# Patient Record
Sex: Female | Born: 1960 | Hispanic: No | Marital: Married | Smoking: Never smoker
Health system: Southern US, Community
[De-identification: ages and names within clinical notes are randomized; demographics above are authoritative.]

## PROBLEM LIST (undated history)

## (undated) DIAGNOSIS — I10 Essential (primary) hypertension: Secondary | ICD-10-CM

## (undated) DIAGNOSIS — E785 Hyperlipidemia, unspecified: Secondary | ICD-10-CM

## (undated) HISTORY — DX: Hyperlipidemia, unspecified: E78.5

## (undated) HISTORY — DX: Essential (primary) hypertension: I10

---

## 2004-04-01 ENCOUNTER — Other Ambulatory Visit: Admission: RE | Admit: 2004-04-01 | Discharge: 2004-04-01 | Payer: Self-pay | Admitting: Family Medicine

## 2005-08-13 ENCOUNTER — Other Ambulatory Visit: Admission: RE | Admit: 2005-08-13 | Discharge: 2005-08-13 | Payer: Self-pay | Admitting: Family Medicine

## 2006-09-11 ENCOUNTER — Other Ambulatory Visit: Admission: RE | Admit: 2006-09-11 | Discharge: 2006-09-11 | Payer: Self-pay | Admitting: Family Medicine

## 2008-10-25 ENCOUNTER — Other Ambulatory Visit: Admission: RE | Admit: 2008-10-25 | Discharge: 2008-10-25 | Payer: Self-pay | Admitting: Family Medicine

## 2009-11-21 ENCOUNTER — Other Ambulatory Visit: Admission: RE | Admit: 2009-11-21 | Discharge: 2009-11-21 | Payer: Self-pay | Admitting: *Deleted

## 2011-01-21 ENCOUNTER — Other Ambulatory Visit: Payer: Self-pay | Admitting: Family Medicine

## 2011-01-21 ENCOUNTER — Other Ambulatory Visit (HOSPITAL_COMMUNITY)
Admission: RE | Admit: 2011-01-21 | Discharge: 2011-01-21 | Disposition: A | Discharge status: Home / Self Care | Payer: Self-pay | Source: Ambulatory Visit | Attending: Family Medicine | Admitting: Family Medicine

## 2011-01-21 DIAGNOSIS — Z124 Encounter for screening for malignant neoplasm of cervix: Secondary | ICD-10-CM | POA: Insufficient documentation

## 2011-03-28 ENCOUNTER — Other Ambulatory Visit: Payer: Self-pay | Admitting: Family Medicine

## 2012-10-21 ENCOUNTER — Other Ambulatory Visit (HOSPITAL_COMMUNITY)
Admission: RE | Admit: 2012-10-21 | Discharge: 2012-10-21 | Disposition: A | Discharge status: Home / Self Care | Payer: Federal, State, Local not specified - PPO | Source: Ambulatory Visit | Attending: Family Medicine | Admitting: Family Medicine

## 2012-10-21 ENCOUNTER — Other Ambulatory Visit: Payer: Self-pay | Admitting: Family Medicine

## 2012-10-21 DIAGNOSIS — Z124 Encounter for screening for malignant neoplasm of cervix: Secondary | ICD-10-CM | POA: Insufficient documentation

## 2013-11-18 ENCOUNTER — Other Ambulatory Visit: Payer: Self-pay | Admitting: Family Medicine

## 2013-11-18 DIAGNOSIS — R209 Unspecified disturbances of skin sensation: Secondary | ICD-10-CM

## 2013-11-20 ENCOUNTER — Other Ambulatory Visit: Payer: Federal, State, Local not specified - PPO

## 2015-10-29 ENCOUNTER — Other Ambulatory Visit (HOSPITAL_COMMUNITY)
Admission: RE | Admit: 2015-10-29 | Discharge: 2015-10-29 | Disposition: A | Discharge status: Home / Self Care | Payer: Federal, State, Local not specified - PPO | Source: Ambulatory Visit | Attending: Family Medicine | Admitting: Family Medicine

## 2015-10-29 ENCOUNTER — Other Ambulatory Visit: Payer: Self-pay | Admitting: Family Medicine

## 2015-10-29 DIAGNOSIS — Z01419 Encounter for gynecological examination (general) (routine) without abnormal findings: Secondary | ICD-10-CM | POA: Insufficient documentation

## 2015-10-30 LAB — CYTOLOGY - PAP

## 2015-12-31 DIAGNOSIS — Z1231 Encounter for screening mammogram for malignant neoplasm of breast: Secondary | ICD-10-CM | POA: Diagnosis not present

## 2015-12-31 DIAGNOSIS — Z139 Encounter for screening, unspecified: Secondary | ICD-10-CM | POA: Diagnosis not present

## 2016-01-31 DIAGNOSIS — L218 Other seborrheic dermatitis: Secondary | ICD-10-CM | POA: Diagnosis not present

## 2016-01-31 DIAGNOSIS — Z809 Family history of malignant neoplasm, unspecified: Secondary | ICD-10-CM | POA: Diagnosis not present

## 2016-01-31 DIAGNOSIS — Z1283 Encounter for screening for malignant neoplasm of skin: Secondary | ICD-10-CM | POA: Diagnosis not present

## 2016-02-26 DIAGNOSIS — K080 Exfoliation of teeth due to systemic causes: Secondary | ICD-10-CM | POA: Diagnosis not present

## 2016-03-04 DIAGNOSIS — E559 Vitamin D deficiency, unspecified: Secondary | ICD-10-CM | POA: Diagnosis not present

## 2016-03-04 DIAGNOSIS — S90861A Insect bite (nonvenomous), right foot, initial encounter: Secondary | ICD-10-CM | POA: Diagnosis not present

## 2016-03-06 DIAGNOSIS — S90861A Insect bite (nonvenomous), right foot, initial encounter: Secondary | ICD-10-CM | POA: Diagnosis not present

## 2016-03-06 DIAGNOSIS — W57XXXA Bitten or stung by nonvenomous insect and other nonvenomous arthropods, initial encounter: Secondary | ICD-10-CM | POA: Diagnosis not present

## 2016-05-22 DIAGNOSIS — L570 Actinic keratosis: Secondary | ICD-10-CM | POA: Diagnosis not present

## 2016-05-22 DIAGNOSIS — Z809 Family history of malignant neoplasm, unspecified: Secondary | ICD-10-CM | POA: Diagnosis not present

## 2016-05-22 DIAGNOSIS — L821 Other seborrheic keratosis: Secondary | ICD-10-CM | POA: Diagnosis not present

## 2016-09-03 DIAGNOSIS — K080 Exfoliation of teeth due to systemic causes: Secondary | ICD-10-CM | POA: Diagnosis not present

## 2016-11-04 DIAGNOSIS — Z79899 Other long term (current) drug therapy: Secondary | ICD-10-CM | POA: Diagnosis not present

## 2016-11-04 DIAGNOSIS — R6889 Other general symptoms and signs: Secondary | ICD-10-CM | POA: Diagnosis not present

## 2016-11-04 DIAGNOSIS — Z13220 Encounter for screening for lipoid disorders: Secondary | ICD-10-CM | POA: Diagnosis not present

## 2016-11-04 DIAGNOSIS — I10 Essential (primary) hypertension: Secondary | ICD-10-CM | POA: Diagnosis not present

## 2016-11-04 DIAGNOSIS — E559 Vitamin D deficiency, unspecified: Secondary | ICD-10-CM | POA: Diagnosis not present

## 2016-11-04 DIAGNOSIS — Z0000 Encounter for general adult medical examination without abnormal findings: Secondary | ICD-10-CM | POA: Diagnosis not present

## 2016-11-04 DIAGNOSIS — G479 Sleep disorder, unspecified: Secondary | ICD-10-CM | POA: Diagnosis not present

## 2016-11-20 DIAGNOSIS — Z809 Family history of malignant neoplasm, unspecified: Secondary | ICD-10-CM | POA: Diagnosis not present

## 2016-11-20 DIAGNOSIS — D225 Melanocytic nevi of trunk: Secondary | ICD-10-CM | POA: Diagnosis not present

## 2016-11-20 DIAGNOSIS — Z1283 Encounter for screening for malignant neoplasm of skin: Secondary | ICD-10-CM | POA: Diagnosis not present

## 2016-12-10 DIAGNOSIS — E2839 Other primary ovarian failure: Secondary | ICD-10-CM | POA: Diagnosis not present

## 2017-01-21 DIAGNOSIS — Z1231 Encounter for screening mammogram for malignant neoplasm of breast: Secondary | ICD-10-CM | POA: Diagnosis not present

## 2017-04-01 DIAGNOSIS — K080 Exfoliation of teeth due to systemic causes: Secondary | ICD-10-CM | POA: Diagnosis not present

## 2017-04-17 DIAGNOSIS — R002 Palpitations: Secondary | ICD-10-CM | POA: Diagnosis not present

## 2017-04-17 DIAGNOSIS — F418 Other specified anxiety disorders: Secondary | ICD-10-CM | POA: Diagnosis not present

## 2017-04-17 DIAGNOSIS — I10 Essential (primary) hypertension: Secondary | ICD-10-CM | POA: Diagnosis not present

## 2017-04-17 DIAGNOSIS — E559 Vitamin D deficiency, unspecified: Secondary | ICD-10-CM | POA: Diagnosis not present

## 2017-04-20 ENCOUNTER — Telehealth: Payer: Self-pay | Admitting: *Deleted

## 2017-04-20 NOTE — Telephone Encounter (Signed)
NOTES SENT TO SCHEDULING.  °

## 2017-04-29 ENCOUNTER — Encounter: Payer: Self-pay | Admitting: Cardiology

## 2017-04-29 ENCOUNTER — Ambulatory Visit (INDEPENDENT_AMBULATORY_CARE_PROVIDER_SITE_OTHER): Payer: Federal, State, Local not specified - PPO | Admitting: Cardiology

## 2017-04-29 VITALS — BP 148/78 | HR 76 | Ht 61.0 in | Wt 151.1 lb

## 2017-04-29 DIAGNOSIS — I10 Essential (primary) hypertension: Secondary | ICD-10-CM | POA: Diagnosis not present

## 2017-04-29 DIAGNOSIS — R0789 Other chest pain: Secondary | ICD-10-CM | POA: Insufficient documentation

## 2017-04-29 DIAGNOSIS — R002 Palpitations: Secondary | ICD-10-CM | POA: Diagnosis not present

## 2017-04-29 NOTE — Patient Instructions (Signed)
Medication Instructions:  Your physician recommends that you continue on your current medications as directed. Please refer to the Current Medication list given to you today.  Labwork: None   Testing/Procedures: Your physician has requested that you have en exercise stress myoview. For further information please visit [email or web address]. Please follow instruction sheet, as given.  Please report to [address], [address] [place], [place] the day of your testing.   Follow-Up: Your physician recommends that you schedule a follow-up appointment in: 1 months   Any Other Special Instructions Will Be Listed Below (If Applicable).  Please note that any paperwork needing to be filled out by the provider will need to be addressed at the front desk prior to seeing the provider. Please note that any paperwork FMLA, Disability or other documents regarding health condition is subject to a $25.00 charge that must be received prior to completion of paperwork in the form of a money order or check.    If you need a refill on your cardiac medications before your next appointment, please call your pharmacy.

## 2017-04-29 NOTE — Progress Notes (Signed)
Cardiology Office Note:    Date:  04/29/2017   ID:  Audrey Mendez, DOB July 11, 1961, MRN [id number]  PCP:  Juluis RainierBarnes, Elizabeth, MD  Cardiologist:  Garwin Brothersajan R Revankar, MD   Referring MD: Juluis RainierBarnes, Elizabeth, MD    ASSESSMENT:    1. Palpitation   2. Chest tightness   3. Essential hypertension   4. Palpitations    PLAN:    In order of problems listed above:  1. Primary prevention stressed to the patient. Importance of compliance with diet and medications stressed. Her chest pain is atypical for coronary etiology however she will need evaluation and therefore she is being scheduled for an exercise stress Cardiolite. She is on appropriate blood pressure medications and her blood pressure stable and she will continue to monitor this. If her stress test is negative then I will see her back in a month I could consider evaluating her for a beta blocker therapy. In the interim if she has any significant symptoms he knows to go to the nearest emergency room. She has a fairly physical job and with what she has  not experienced any chest pain or chest tightness. Her lipids are followed by her primary care physician. She will be seen in follow-up appointment in a month or earlier if she has any concerns. In the interim if she has any significant symptoms he knows to go to the nearest emergency room.   Medication Adjustments/Labs and Tests Ordered: Current medicines are reviewed at length with the patient today.  Concerns regarding medicines are outlined above.  Orders Placed This Encounter  Procedures  . Myocardial Perfusion Imaging  . EKG 12-Lead   No orders of the defined types were placed in this encounter.    History of Present Illness:    Audrey Mendez is a 56 y.o. female who is being seen today for the evaluation of Chest tightness and palpitations at the request of Juluis RainierBarnes, Elizabeth, MD. Patient is a pleasant 56 year old female. She has past medical history of essential hypertension. She  mentions to me that her husband is in no distress. Saturation at work. This has caused significant concern to her. She mentions to me that she occasionally has palpitations but when she feels this she checks her pulse and her findings are that her heart rate is within normal limits less than 70 or 80/m. She also complains of mild chest tightness not related to exertion. No orthopnea or PND. No radiation of the symptoms to any parts of the body. At the time of my evaluation she is alert awake oriented and in no distress.  History reviewed. No pertinent past medical history.  History reviewed. No pertinent surgical history.  Current Medications: Current Meds  Medication Sig  . Cholecalciferol (VITAMIN D) 2000 units CAPS Take 1 capsule by mouth daily.  Marland Kitchen. LORazepam (ATIVAN) 0.5 MG tablet Take 0.5 mg by mouth at bedtime as needed.  . quinapril-hydrochlorothiazide (ACCURETIC) 20-12.5 MG tablet Take 2 tablets by mouth 2 (two) times daily.     Allergies:   Sulfur colloidal [sulfur]; Penicillins; and Zoloft [sertraline hcl]   Social History   Social History  . Marital status: Unknown    Spouse name: N/A  . Number of children: N/A  . Years of education: N/A   Social History Main Topics  . Smoking status: Never Smoker  . Smokeless tobacco: Never Used  . Alcohol use No  . Drug use: No  . Sexual activity: Not Asked   Other Topics Concern  . None  Social History Narrative  . None     Family History: The patient's family history includes Atrial fibrillation in her mother; Heart attack in her brother; Heart failure in her father and mother; Hypertension in her mother.  ROS:   Please see the history of present illness.    All other systems reviewed and are negative.  EKGs/Labs/Other Studies Reviewed:    The following studies were reviewed today: I reviewed records from the primary care physician's office.   Recent Labs: No results found for requested labs within last 8760 hours.    Recent Lipid Panel No results found for: CHOL, TRIG, HDL, CHOLHDL, VLDL, LDLCALC, LDLDIRECT  Physical Exam:    VS:  BP (!) 148/78   Pulse 76   Ht 5\' 1"  (1.549 m)   Wt 151 lb 1.3 oz (68.5 kg)   SpO2 98%   BMI 28.55 kg/m     Wt Readings from Last 3 Encounters:  04/29/17 151 lb 1.3 oz (68.5 kg)     GEN: Patient is in no acute distress HEENT: Normal NECK: No JVD; No carotid bruits LYMPHATICS: No lymphadenopathy CARDIAC: S1 S2 regular, 2/6 systolic murmur at the apex. RESPIRATORY:  Clear to auscultation without rales, wheezing or rhonchi  ABDOMEN: Soft, non-tender, non-distended MUSCULOSKELETAL:  No edema; No deformity  SKIN: Warm and dry NEUROLOGIC:  Alert and oriented x 3 PSYCHIATRIC:  Normal affect    Signed, Garwin Brothers, MD  04/29/2017 4:50 PM    [facility]

## 2017-04-30 ENCOUNTER — Telehealth (HOSPITAL_COMMUNITY): Payer: Self-pay | Admitting: *Deleted

## 2017-04-30 NOTE — Telephone Encounter (Signed)
Patient given detailed instructions per Myocardial Perfusion Study Information Sheet for the test on 05/04/17 Patient notified to arrive 15 minutes early and that it is imperative to arrive on time for appointment to keep from having the test rescheduled.  If you need to cancel or reschedule your appointment, please call the office within 24 hours of your appointment. . Patient verbalized understanding. Audrey Mendez    

## 2017-05-04 ENCOUNTER — Ambulatory Visit (HOSPITAL_COMMUNITY): Payer: Federal, State, Local not specified - PPO | Attending: Cardiovascular Disease

## 2017-05-04 DIAGNOSIS — R002 Palpitations: Secondary | ICD-10-CM

## 2017-05-04 LAB — MYOCARDIAL PERFUSION IMAGING
CHL CUP MPHR: 163 {beats}/min
CHL CUP NUCLEAR SDS: 5
CHL CUP NUCLEAR SRS: 3
CHL CUP NUCLEAR SSS: 8
CHL CUP RESTING HR STRESS: 61 {beats}/min
CSEPED: 9 min
CSEPEW: 10.1 METS
CSEPPHR: 148 {beats}/min
Exercise duration (sec): 0 s
LHR: 0.32
LV dias vol: 70 mL (ref 46–106)
LV sys vol: 27 mL
Percent HR: 90 %
TID: 0.99

## 2017-05-04 MED ORDER — TECHNETIUM TC 99M TETROFOSMIN IV KIT
32.4000 | PACK | Freq: Once | INTRAVENOUS | Status: AC | PRN
  Administered 2017-05-04: 32.4 via INTRAVENOUS
  Filled 2017-05-04: qty 33

## 2017-05-04 MED ORDER — TECHNETIUM TC 99M TETROFOSMIN IV KIT
11.0000 | PACK | Freq: Once | INTRAVENOUS | Status: AC | PRN
  Administered 2017-05-04: 11 via INTRAVENOUS
  Filled 2017-05-04: qty 11

## 2017-06-01 ENCOUNTER — Ambulatory Visit: Payer: Federal, State, Local not specified - PPO | Admitting: Cardiology

## 2017-07-21 NOTE — Telephone Encounter (Signed)
Error

## 2017-10-12 DIAGNOSIS — K080 Exfoliation of teeth due to systemic causes: Secondary | ICD-10-CM | POA: Diagnosis not present

## 2017-11-06 DIAGNOSIS — Z0000 Encounter for general adult medical examination without abnormal findings: Secondary | ICD-10-CM | POA: Diagnosis not present

## 2017-11-06 DIAGNOSIS — I10 Essential (primary) hypertension: Secondary | ICD-10-CM | POA: Diagnosis not present

## 2017-11-06 DIAGNOSIS — G479 Sleep disorder, unspecified: Secondary | ICD-10-CM | POA: Diagnosis not present

## 2017-11-06 DIAGNOSIS — E7800 Pure hypercholesterolemia, unspecified: Secondary | ICD-10-CM | POA: Diagnosis not present

## 2017-11-06 DIAGNOSIS — E559 Vitamin D deficiency, unspecified: Secondary | ICD-10-CM | POA: Diagnosis not present

## 2017-11-06 DIAGNOSIS — Z79899 Other long term (current) drug therapy: Secondary | ICD-10-CM | POA: Diagnosis not present

## 2017-11-10 ENCOUNTER — Other Ambulatory Visit: Payer: Self-pay | Admitting: Family Medicine

## 2017-11-10 DIAGNOSIS — R198 Other specified symptoms and signs involving the digestive system and abdomen: Secondary | ICD-10-CM

## 2017-11-19 ENCOUNTER — Ambulatory Visit
Admission: RE | Admit: 2017-11-19 | Discharge: 2017-11-19 | Disposition: A | Discharge status: Home / Self Care | Payer: Federal, State, Local not specified - PPO | Source: Ambulatory Visit | Attending: Family Medicine | Admitting: Family Medicine

## 2017-11-19 DIAGNOSIS — K7689 Other specified diseases of liver: Secondary | ICD-10-CM | POA: Diagnosis not present

## 2017-11-19 DIAGNOSIS — R198 Other specified symptoms and signs involving the digestive system and abdomen: Secondary | ICD-10-CM

## 2017-11-24 DIAGNOSIS — L218 Other seborrheic dermatitis: Secondary | ICD-10-CM | POA: Diagnosis not present

## 2017-11-24 DIAGNOSIS — D239 Other benign neoplasm of skin, unspecified: Secondary | ICD-10-CM | POA: Diagnosis not present

## 2017-11-24 DIAGNOSIS — D1801 Hemangioma of skin and subcutaneous tissue: Secondary | ICD-10-CM | POA: Diagnosis not present

## 2018-04-19 DIAGNOSIS — K080 Exfoliation of teeth due to systemic causes: Secondary | ICD-10-CM | POA: Diagnosis not present

## 2018-06-29 IMAGING — NM NM MISC PROCEDURE
3 series · 18 of 18 positions shown · non-contrast
Comparison: none

[Series 1: wbr_s-proj_st stress_(id)_sa · 6.5mm · 6.51mm/px · 6 of 512 frames shown (1 of 2)]
[frame 43/512]
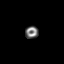
[frame 128/512]
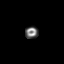
[frame 214/512]
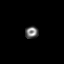
[frame 299/512]
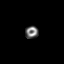
[frame 384/512]
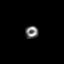
[frame 470/512]
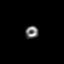

[Series 1: wbr_r-proj_st rest_(id)_sa · 6.5mm · 6.51mm/px · 6 of 64 frames shown]
[frame 6/64]
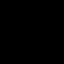
[frame 16/64]
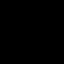
[frame 27/64]
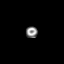
[frame 38/64]
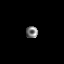
[frame 48/64]
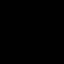
[frame 59/64]
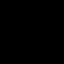

[Series 1: wbr_s-proj_st stress_(id)_sa · 6.5mm · 6.51mm/px · 6 of 64 frames shown (2 of 2)]
[frame 6/64]
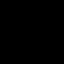
[frame 16/64]
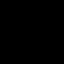
[frame 27/64]
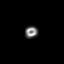
[frame 38/64]
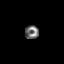
[frame 48/64]
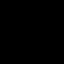
[frame 59/64]
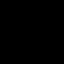

[18 of 18 positions shown; findings below may reference images not displayed]

Canned report from images found in remote index.

Refer to host system for actual result text.

## 2018-08-17 DIAGNOSIS — Z1239 Encounter for other screening for malignant neoplasm of breast: Secondary | ICD-10-CM | POA: Diagnosis not present

## 2018-08-17 DIAGNOSIS — Z1231 Encounter for screening mammogram for malignant neoplasm of breast: Secondary | ICD-10-CM | POA: Diagnosis not present

## 2018-11-19 DIAGNOSIS — E559 Vitamin D deficiency, unspecified: Secondary | ICD-10-CM | POA: Diagnosis not present

## 2018-11-19 DIAGNOSIS — E7800 Pure hypercholesterolemia, unspecified: Secondary | ICD-10-CM | POA: Diagnosis not present

## 2018-11-19 DIAGNOSIS — Z0000 Encounter for general adult medical examination without abnormal findings: Secondary | ICD-10-CM | POA: Diagnosis not present

## 2018-12-02 DIAGNOSIS — L218 Other seborrheic dermatitis: Secondary | ICD-10-CM | POA: Diagnosis not present

## 2018-12-02 DIAGNOSIS — D0362 Melanoma in situ of left upper limb, including shoulder: Secondary | ICD-10-CM | POA: Diagnosis not present

## 2018-12-02 DIAGNOSIS — D485 Neoplasm of uncertain behavior of skin: Secondary | ICD-10-CM | POA: Diagnosis not present

## 2018-12-02 DIAGNOSIS — D1801 Hemangioma of skin and subcutaneous tissue: Secondary | ICD-10-CM | POA: Diagnosis not present

## 2018-12-20 DIAGNOSIS — D0362 Melanoma in situ of left upper limb, including shoulder: Secondary | ICD-10-CM | POA: Diagnosis not present

## 2019-11-21 ENCOUNTER — Other Ambulatory Visit: Payer: Self-pay | Admitting: Family Medicine

## 2019-11-21 ENCOUNTER — Other Ambulatory Visit (HOSPITAL_COMMUNITY)
Admission: RE | Admit: 2019-11-21 | Discharge: 2019-11-21 | Disposition: A | Discharge status: Home / Self Care | Payer: Federal, State, Local not specified - PPO | Source: Ambulatory Visit | Attending: Family Medicine | Admitting: Family Medicine

## 2019-11-21 DIAGNOSIS — Z124 Encounter for screening for malignant neoplasm of cervix: Secondary | ICD-10-CM | POA: Insufficient documentation

## 2019-11-23 LAB — CYTOLOGY - PAP: Diagnosis: NEGATIVE

## 2020-04-16 LAB — HM COLONOSCOPY

## 2021-12-10 DIAGNOSIS — I10 Essential (primary) hypertension: Secondary | ICD-10-CM | POA: Diagnosis not present

## 2021-12-10 DIAGNOSIS — Z23 Encounter for immunization: Secondary | ICD-10-CM | POA: Diagnosis not present

## 2021-12-10 DIAGNOSIS — Z0000 Encounter for general adult medical examination without abnormal findings: Secondary | ICD-10-CM | POA: Diagnosis not present

## 2022-01-09 DIAGNOSIS — L578 Other skin changes due to chronic exposure to nonionizing radiation: Secondary | ICD-10-CM | POA: Diagnosis not present

## 2022-01-09 DIAGNOSIS — D1801 Hemangioma of skin and subcutaneous tissue: Secondary | ICD-10-CM | POA: Diagnosis not present

## 2022-01-09 DIAGNOSIS — D2372 Other benign neoplasm of skin of left lower limb, including hip: Secondary | ICD-10-CM | POA: Diagnosis not present

## 2022-01-09 DIAGNOSIS — Z85820 Personal history of malignant melanoma of skin: Secondary | ICD-10-CM | POA: Diagnosis not present

## 2022-02-07 DIAGNOSIS — M5442 Lumbago with sciatica, left side: Secondary | ICD-10-CM | POA: Diagnosis not present

## 2022-02-17 DIAGNOSIS — M5442 Lumbago with sciatica, left side: Secondary | ICD-10-CM | POA: Diagnosis not present

## 2022-02-19 DIAGNOSIS — M5442 Lumbago with sciatica, left side: Secondary | ICD-10-CM | POA: Diagnosis not present

## 2022-02-24 DIAGNOSIS — M5442 Lumbago with sciatica, left side: Secondary | ICD-10-CM | POA: Diagnosis not present

## 2022-02-26 DIAGNOSIS — M5442 Lumbago with sciatica, left side: Secondary | ICD-10-CM | POA: Diagnosis not present

## 2022-03-03 DIAGNOSIS — M5442 Lumbago with sciatica, left side: Secondary | ICD-10-CM | POA: Diagnosis not present

## 2022-03-10 DIAGNOSIS — M5442 Lumbago with sciatica, left side: Secondary | ICD-10-CM | POA: Diagnosis not present

## 2022-03-17 DIAGNOSIS — M5442 Lumbago with sciatica, left side: Secondary | ICD-10-CM | POA: Diagnosis not present

## 2022-03-26 DIAGNOSIS — M5442 Lumbago with sciatica, left side: Secondary | ICD-10-CM | POA: Diagnosis not present

## 2022-07-14 DIAGNOSIS — M9902 Segmental and somatic dysfunction of thoracic region: Secondary | ICD-10-CM | POA: Diagnosis not present

## 2022-07-14 DIAGNOSIS — M5442 Lumbago with sciatica, left side: Secondary | ICD-10-CM | POA: Diagnosis not present

## 2022-07-14 DIAGNOSIS — M9901 Segmental and somatic dysfunction of cervical region: Secondary | ICD-10-CM | POA: Diagnosis not present

## 2022-07-14 DIAGNOSIS — M5413 Radiculopathy, cervicothoracic region: Secondary | ICD-10-CM | POA: Diagnosis not present

## 2022-07-15 DIAGNOSIS — M9901 Segmental and somatic dysfunction of cervical region: Secondary | ICD-10-CM | POA: Diagnosis not present

## 2022-07-15 DIAGNOSIS — M5413 Radiculopathy, cervicothoracic region: Secondary | ICD-10-CM | POA: Diagnosis not present

## 2022-07-15 DIAGNOSIS — M9902 Segmental and somatic dysfunction of thoracic region: Secondary | ICD-10-CM | POA: Diagnosis not present

## 2022-07-15 DIAGNOSIS — M5442 Lumbago with sciatica, left side: Secondary | ICD-10-CM | POA: Diagnosis not present

## 2022-07-16 DIAGNOSIS — M9902 Segmental and somatic dysfunction of thoracic region: Secondary | ICD-10-CM | POA: Diagnosis not present

## 2022-07-16 DIAGNOSIS — M5413 Radiculopathy, cervicothoracic region: Secondary | ICD-10-CM | POA: Diagnosis not present

## 2022-07-16 DIAGNOSIS — M9901 Segmental and somatic dysfunction of cervical region: Secondary | ICD-10-CM | POA: Diagnosis not present

## 2022-07-16 DIAGNOSIS — M5442 Lumbago with sciatica, left side: Secondary | ICD-10-CM | POA: Diagnosis not present

## 2022-07-17 DIAGNOSIS — M9901 Segmental and somatic dysfunction of cervical region: Secondary | ICD-10-CM | POA: Diagnosis not present

## 2022-07-17 DIAGNOSIS — M5413 Radiculopathy, cervicothoracic region: Secondary | ICD-10-CM | POA: Diagnosis not present

## 2022-07-17 DIAGNOSIS — M9902 Segmental and somatic dysfunction of thoracic region: Secondary | ICD-10-CM | POA: Diagnosis not present

## 2022-07-17 DIAGNOSIS — M5442 Lumbago with sciatica, left side: Secondary | ICD-10-CM | POA: Diagnosis not present

## 2022-07-22 DIAGNOSIS — M5413 Radiculopathy, cervicothoracic region: Secondary | ICD-10-CM | POA: Diagnosis not present

## 2022-07-22 DIAGNOSIS — M9901 Segmental and somatic dysfunction of cervical region: Secondary | ICD-10-CM | POA: Diagnosis not present

## 2022-07-22 DIAGNOSIS — M9902 Segmental and somatic dysfunction of thoracic region: Secondary | ICD-10-CM | POA: Diagnosis not present

## 2022-07-22 DIAGNOSIS — M5442 Lumbago with sciatica, left side: Secondary | ICD-10-CM | POA: Diagnosis not present

## 2022-07-24 DIAGNOSIS — M5442 Lumbago with sciatica, left side: Secondary | ICD-10-CM | POA: Diagnosis not present

## 2022-07-24 DIAGNOSIS — M9902 Segmental and somatic dysfunction of thoracic region: Secondary | ICD-10-CM | POA: Diagnosis not present

## 2022-07-24 DIAGNOSIS — M9901 Segmental and somatic dysfunction of cervical region: Secondary | ICD-10-CM | POA: Diagnosis not present

## 2022-07-24 DIAGNOSIS — M5413 Radiculopathy, cervicothoracic region: Secondary | ICD-10-CM | POA: Diagnosis not present

## 2022-07-28 DIAGNOSIS — M9901 Segmental and somatic dysfunction of cervical region: Secondary | ICD-10-CM | POA: Diagnosis not present

## 2022-07-28 DIAGNOSIS — M5413 Radiculopathy, cervicothoracic region: Secondary | ICD-10-CM | POA: Diagnosis not present

## 2022-07-28 DIAGNOSIS — M9902 Segmental and somatic dysfunction of thoracic region: Secondary | ICD-10-CM | POA: Diagnosis not present

## 2022-07-28 DIAGNOSIS — M5442 Lumbago with sciatica, left side: Secondary | ICD-10-CM | POA: Diagnosis not present

## 2022-10-02 DIAGNOSIS — Z1231 Encounter for screening mammogram for malignant neoplasm of breast: Secondary | ICD-10-CM | POA: Diagnosis not present

## 2022-10-20 DIAGNOSIS — H43812 Vitreous degeneration, left eye: Secondary | ICD-10-CM | POA: Diagnosis not present

## 2022-12-16 DIAGNOSIS — E782 Mixed hyperlipidemia: Secondary | ICD-10-CM | POA: Diagnosis not present

## 2022-12-16 DIAGNOSIS — E559 Vitamin D deficiency, unspecified: Secondary | ICD-10-CM | POA: Diagnosis not present

## 2022-12-16 DIAGNOSIS — Z0000 Encounter for general adult medical examination without abnormal findings: Secondary | ICD-10-CM | POA: Diagnosis not present

## 2022-12-16 DIAGNOSIS — I10 Essential (primary) hypertension: Secondary | ICD-10-CM | POA: Diagnosis not present

## 2022-12-16 DIAGNOSIS — Z79899 Other long term (current) drug therapy: Secondary | ICD-10-CM | POA: Diagnosis not present

## 2023-01-20 DIAGNOSIS — Z85820 Personal history of malignant melanoma of skin: Secondary | ICD-10-CM | POA: Diagnosis not present

## 2023-01-20 DIAGNOSIS — L821 Other seborrheic keratosis: Secondary | ICD-10-CM | POA: Diagnosis not present

## 2023-01-20 DIAGNOSIS — L718 Other rosacea: Secondary | ICD-10-CM | POA: Diagnosis not present

## 2023-01-20 DIAGNOSIS — D1801 Hemangioma of skin and subcutaneous tissue: Secondary | ICD-10-CM | POA: Diagnosis not present

## 2023-06-03 ENCOUNTER — Encounter: Payer: Self-pay | Admitting: Family Medicine

## 2023-06-03 ENCOUNTER — Ambulatory Visit: Payer: Federal, State, Local not specified - PPO | Admitting: Family Medicine

## 2023-06-03 VITALS — BP 120/76 | HR 76 | Temp 97.8°F | Ht 61.0 in | Wt 161.0 lb

## 2023-06-03 DIAGNOSIS — Z23 Encounter for immunization: Secondary | ICD-10-CM

## 2023-06-03 DIAGNOSIS — F439 Reaction to severe stress, unspecified: Secondary | ICD-10-CM | POA: Insufficient documentation

## 2023-06-03 DIAGNOSIS — E559 Vitamin D deficiency, unspecified: Secondary | ICD-10-CM

## 2023-06-03 DIAGNOSIS — E7800 Pure hypercholesterolemia, unspecified: Secondary | ICD-10-CM | POA: Insufficient documentation

## 2023-06-03 DIAGNOSIS — M5442 Lumbago with sciatica, left side: Secondary | ICD-10-CM | POA: Insufficient documentation

## 2023-06-03 DIAGNOSIS — I10 Essential (primary) hypertension: Secondary | ICD-10-CM

## 2023-06-03 DIAGNOSIS — G479 Sleep disorder, unspecified: Secondary | ICD-10-CM | POA: Insufficient documentation

## 2023-06-03 DIAGNOSIS — E669 Obesity, unspecified: Secondary | ICD-10-CM | POA: Diagnosis not present

## 2023-06-03 DIAGNOSIS — Z7689 Persons encountering health services in other specified circumstances: Secondary | ICD-10-CM

## 2023-06-03 DIAGNOSIS — Z6830 Body mass index (BMI) 30.0-30.9, adult: Secondary | ICD-10-CM | POA: Diagnosis not present

## 2023-06-03 DIAGNOSIS — F5104 Psychophysiologic insomnia: Secondary | ICD-10-CM | POA: Insufficient documentation

## 2023-06-03 LAB — COMPREHENSIVE METABOLIC PANEL WITH GFR
ALT: 16 U/L (ref 0–35)
AST: 20 U/L (ref 0–37)
Albumin: 4.1 g/dL (ref 3.5–5.2)
Alkaline Phosphatase: 46 U/L (ref 39–117)
BUN: 17 mg/dL (ref 6–23)
CO2: 33 meq/L — ABNORMAL HIGH (ref 19–32)
Calcium: 9.3 mg/dL (ref 8.4–10.5)
Chloride: 101 meq/L (ref 96–112)
Creatinine, Ser: 0.85 mg/dL (ref 0.40–1.20)
GFR: 72.71 mL/min (ref >60.00)
Glucose, Bld: 98 mg/dL (ref 70–99)
Potassium: 3.6 meq/L (ref 3.5–5.1)
Sodium: 140 meq/L (ref 135–145)
Total Bilirubin: 0.3 mg/dL (ref 0.2–1.2)
Total Protein: 7.1 g/dL (ref 6.0–8.3)

## 2023-06-03 LAB — CBC WITH DIFFERENTIAL/PLATELET
Basophils Absolute: 0.1 10*3/uL (ref 0.0–0.1)
Basophils Relative: 0.6 % (ref 0.0–3.0)
Eosinophils Absolute: 0.2 10*3/uL (ref 0.0–0.7)
Eosinophils Relative: 2.5 % (ref 0.0–5.0)
HCT: 39.3 % (ref 36.0–46.0)
Hemoglobin: 12.9 g/dL (ref 12.0–15.0)
Lymphocytes Relative: 36.3 % (ref 12.0–46.0)
Lymphs Abs: 3.3 10*3/uL (ref 0.7–4.0)
MCHC: 32.9 g/dL (ref 30.0–36.0)
MCV: 93.3 fl (ref 78.0–100.0)
Monocytes Absolute: 0.8 10*3/uL (ref 0.1–1.0)
Monocytes Relative: 8.4 % (ref 3.0–12.0)
Neutro Abs: 4.8 10*3/uL (ref 1.4–7.7)
Neutrophils Relative %: 52.2 % (ref 43.0–77.0)
Platelets: 246 10*3/uL (ref 150.0–400.0)
RBC: 4.21 Mil/uL (ref 3.87–5.11)
RDW: 13.3 % (ref 11.5–15.5)
WBC: 9.2 10*3/uL (ref 4.0–10.5)

## 2023-06-03 LAB — VITAMIN D 25 HYDROXY (VIT D DEFICIENCY, FRACTURES): VITD: 53.87 ng/mL (ref 30.00–100.00)

## 2023-06-03 NOTE — Progress Notes (Signed)
New Patient Office Visit  Subjective    Patient ID: Audrey Mendez, female    DOB: 1961/01/04  Age: 62 y.o. MRN: [id number]  CC:  Chief Complaint  Patient presents with   Establish Care    No concerns    HPI Aleja Phifer presents to establish care Previous PCP at [facility].  Last CPE 12/2022  Other providers: [facility]  Ortho- plantar fascitis   HTN- on lisinopril 40 mg and hydrochlorothiazide 25 mg   Vitamin D def- taking a supplement   Mixed HLD -diet controlled   Allergies and eczema   Last colonoscopy in 2021  Mammogram UTD    Outpatient Encounter Medications as of 06/03/2023  Medication Sig   Cholecalciferol (VITAMIN D) 2000 units CAPS Take 1 capsule by mouth daily.   hydrochlorothiazide (HYDRODIURIL) 25 MG tablet Take by mouth.   ketoconazole (NIZORAL) 2 % shampoo Apply 1 Application topically 2 (two) times a week.   lisinopril (ZESTRIL) 40 MG tablet Take 40 mg by mouth daily.   Multiple Vitamin (MULTIVITAMIN ADULT PO) Take by mouth.   [DISCONTINUED] LORazepam (ATIVAN) 0.5 MG tablet Take 0.5 mg by mouth at bedtime as needed.   [DISCONTINUED] quinapril-hydrochlorothiazide (ACCURETIC) 20-12.5 MG tablet Take 2 tablets by mouth 2 (two) times daily.   No facility-administered encounter medications on file as of 06/03/2023.    Past Medical History:  Diagnosis Date   Hyperlipidemia    Hypertension     History reviewed. No pertinent surgical history.  Family History  Problem Relation Age of Onset   Heart failure Mother    Hypertension Mother    Atrial fibrillation Mother    Heart failure Father    Heart attack Brother     Social History   Socioeconomic History   Marital status: Married    Spouse name: Not on file   Number of children: Not on file   Years of education: Not on file   Highest education level: Not on file  Occupational History   Not on file  Tobacco Use   Smoking status: Never   Smokeless tobacco: Never  Vaping  Use   Vaping status: Never Used  Substance and Sexual Activity   Alcohol use: No   Drug use: No   Sexual activity: Not Currently    Birth control/protection: Post-menopausal  Other Topics Concern   Not on file  Social History Narrative   Not on file   Social Determinants of Health   Financial Resource Strain: Not on file  Food Insecurity: Not on file  Transportation Needs: Not on file  Physical Activity: Low Risk  (05/27/2022)   Received from [facility]   PCARE Exercise SDOH    Exercise: Active Lifestyle Only    PCare Exercise SDOH: Not on file    PCare Exercise SDOH: Not on file  Stress: Not on file  Social Connections: Not on file  Intimate Partner Violence: Not on file    Review of Systems  Constitutional:  Negative for chills, fever and malaise/fatigue.  Respiratory:  Negative for shortness of breath.   Cardiovascular:  Negative for chest pain, palpitations and leg swelling.  Gastrointestinal:  Negative for abdominal pain, constipation, diarrhea, nausea and vomiting.  Genitourinary:  Negative for dysuria, frequency and urgency.  Neurological:  Negative for dizziness, focal weakness and headaches.  Psychiatric/Behavioral:  Negative for depression. The patient is not nervous/anxious.         Objective    BP 120/76 (BP Location: Left  Arm, Patient Position: Sitting, Cuff Size: Large)   Pulse 76   Temp 97.8 F (36.6 C) (Temporal)   Ht 5\' 1"  (1.549 m)   Wt 161 lb (73 kg)   SpO2 98%   BMI 30.42 kg/m   Physical Exam Constitutional:      General: She is not in acute distress.    Appearance: She is not ill-appearing.  Eyes:     Extraocular Movements: Extraocular movements intact.     Conjunctiva/sclera: Conjunctivae normal.  Cardiovascular:     Rate and Rhythm: Normal rate and regular rhythm.  Pulmonary:     Effort: Pulmonary effort is normal.     Breath sounds: Normal breath sounds.  Musculoskeletal:     Cervical back: Normal range of motion  and neck supple.     Right lower leg: No edema.     Left lower leg: No edema.  Skin:    General: Skin is warm and dry.  Neurological:     General: No focal deficit present.     Mental Status: She is alert and oriented to person, place, and time.     Motor: No weakness.     Coordination: Coordination normal.     Gait: Gait normal.  Psychiatric:        Mood and Affect: Mood normal.        Behavior: Behavior normal.        Thought Content: Thought content normal.         Assessment & Plan:   Problem List Items Addressed This Visit       Cardiovascular and Mediastinum   Essential hypertension - Primary   Relevant Medications   hydrochlorothiazide (HYDRODIURIL) 25 MG tablet   lisinopril (ZESTRIL) 40 MG tablet   Other Relevant Orders   CBC with Differential/Platelet (Completed)   Comprehensive metabolic panel (Completed)     Other   Vitamin D deficiency   Relevant Orders   VITAMIN D 25 Hydroxy (Vit-D Deficiency, Fractures) (Completed)   Other Visit Diagnoses     Encounter to establish care       Obesity (BMI 30.0-34.9)       Relevant Orders   CBC with Differential/Platelet (Completed)   Comprehensive metabolic panel (Completed)   Need for influenza vaccination       Relevant Orders   Flu vaccine trivalent PF, 6mos and older(Flulaval,Afluria,Fluarix,Fluzone) (Completed)      BP controlled. Check renal function. Continue current regimen.  Continue vitamin D. Check vitamin D level.  Follow up when due for CPE, fasting.   Return in about 7 months (around 01/01/2024) for fasting CPE.   Hetty Blend, NP-C

## 2023-06-03 NOTE — Patient Instructions (Addendum)
Please go downstairs for labs.    

## 2023-10-06 DIAGNOSIS — Z1239 Encounter for other screening for malignant neoplasm of breast: Secondary | ICD-10-CM | POA: Diagnosis not present

## 2023-10-06 DIAGNOSIS — Z1231 Encounter for screening mammogram for malignant neoplasm of breast: Secondary | ICD-10-CM | POA: Diagnosis not present

## 2023-10-06 LAB — HM MAMMOGRAPHY

## 2023-10-08 ENCOUNTER — Ambulatory Visit: Payer: Federal, State, Local not specified - PPO | Admitting: Family Medicine

## 2023-10-08 ENCOUNTER — Ambulatory Visit: Payer: Federal, State, Local not specified - PPO

## 2023-10-08 ENCOUNTER — Encounter: Payer: Self-pay | Admitting: Family Medicine

## 2023-10-08 VITALS — BP 96/62 | HR 108 | Temp 98.0°F | Ht 61.0 in | Wt 158.0 lb

## 2023-10-08 DIAGNOSIS — J3489 Other specified disorders of nose and nasal sinuses: Secondary | ICD-10-CM

## 2023-10-08 DIAGNOSIS — R531 Weakness: Secondary | ICD-10-CM | POA: Diagnosis not present

## 2023-10-08 DIAGNOSIS — R051 Acute cough: Secondary | ICD-10-CM | POA: Diagnosis not present

## 2023-10-08 DIAGNOSIS — R5383 Other fatigue: Secondary | ICD-10-CM

## 2023-10-08 DIAGNOSIS — J101 Influenza due to other identified influenza virus with other respiratory manifestations: Secondary | ICD-10-CM | POA: Diagnosis not present

## 2023-10-08 DIAGNOSIS — R059 Cough, unspecified: Secondary | ICD-10-CM | POA: Diagnosis not present

## 2023-10-08 DIAGNOSIS — J22 Unspecified acute lower respiratory infection: Secondary | ICD-10-CM

## 2023-10-08 LAB — POCT INFLUENZA A/B
Influenza A, POC: POSITIVE — AB
Influenza B, POC: NEGATIVE

## 2023-10-08 LAB — POC COVID19 BINAXNOW: SARS Coronavirus 2 Ag: NEGATIVE

## 2023-10-08 NOTE — Progress Notes (Signed)
No sign of pneumonia on her chest X ray. I wanted to get labs but did not get the orders in before she left. We can hold off and only get blood work if she is feeling worse or she can come back to the lab to get this done. No antibiotics ordered for now since we know she has the flu.

## 2023-10-08 NOTE — Addendum Note (Signed)
Addended by: Avanell Shackleton on: 10/08/2023 01:07 PM   Modules accepted: Orders

## 2023-10-08 NOTE — Progress Notes (Addendum)
Subjective:  Audrey Mendez is a 63 y.o. female who presents for a 1 wk hx of cough and chest congestion, fatigue, purulent nasal drainage. Feels lightheaded.  Fever earlier this week.   Denies headache, ear pain, sinus pressure, ST, chest pain, palpitations, shortness of breath, wheezing, abdominal pain, N/V/D.   Taking Robitussin    ROS as in subjective.   Objective: Vitals:   10/08/23 1116  BP: 96/62  Pulse: (!) 108  Temp: 98 F (36.7 C)    General appearance: Alert, WD/WN, no distress, mildly ill appearing                             Skin: warm, no rash                           Head: no sinus tenderness                            Eyes: conjunctiva normal, corneas clear, PERRLA                            Ears: pearly TMs, external ear canals normal                          Nose: septum midline, turbinates swollen, with erythema              Mouth/throat: MMM, tongue normal, mild pharyngeal erythema                           Neck: supple, no adenopathy, no thyromegaly, nontender                          Heart: tachycardia, regular rhythm                         Lungs: diminished LLL, no wheezes, rales, or rhonchi      Assessment: Influenza A  Acute cough - Plan: DG Chest 2 View, POC COVID-19 BinaxNow, POCT Influenza A/B  Fatigue, unspecified type - Plan: DG Chest 2 View, POC COVID-19 BinaxNow, POCT Influenza A/B, CBC with Differential/Platelet, Basic metabolic panel  Generalized weakness - Plan: DG Chest 2 View, POC COVID-19 BinaxNow, POCT Influenza A/B, CBC with Differential/Platelet, Basic metabolic panel  Purulent nasal discharge - Plan: POC COVID-19 BinaxNow, POCT Influenza A/B  Lower respiratory infection   Plan: +flu A. 5-6 days from onset of symptoms. H&P concerning for pneumonia or dehydration.  Covid test- neg Encouraged increased fluid intake.   Suggested symptomatic OTC remedies. Tylenol or Ibuprofen OTC prn.  Follow up pending results.  Strict  precautions to call 911 or go to the ED if much worse.

## 2023-10-08 NOTE — Patient Instructions (Addendum)
You have flu A.   Increase your fluid intake.   If you feel much worse (fast heart rate, chest pain, shortness of breath, pass out) then you should call 911 or have your husband take you to the emergency department.   I will be in touch with your X ray results and send in an antibiotic to your pharmacy.   Take Tylenol or ibuprofen and Robitussin or Mucinex.   Let us know if you are not back to baseline after you complete the antibiotic.

## 2023-10-13 ENCOUNTER — Telehealth: Payer: Self-pay | Admitting: Family Medicine

## 2023-10-13 DIAGNOSIS — N6320 Unspecified lump in the left breast, unspecified quadrant: Secondary | ICD-10-CM

## 2023-10-13 NOTE — Telephone Encounter (Signed)
[facility] requesting diagnostic mammo order

## 2023-10-13 NOTE — Telephone Encounter (Signed)
Copied from CRM [id number]. Topic: Clinical - Medication Question >> Oct 13, 2023 11:40 AM Sonny Dandy B wrote: Reason for CRM: pt called regarding abnormal mammogram done on  10/06/23 she states no one has contacted her regarding the results. Pt state [facility] is requesting the provider order for a Diagnostic mammogram with Ultra sound. Pt states she has a large mass on Left breast. Please call pt back at [phone]

## 2023-10-13 NOTE — Telephone Encounter (Signed)
Copied from CRM [id number]. Topic: Clinical - Request for Lab/Test Order >> Oct 13, 2023 11:37 AM Thomes Dinning wrote: Reason for CRM: Deanna from:  [facility] [phone]  Called advising they've completed imagining for the patient but an additional order is needed for the left breast: diagnostic mammogram and left breast: ultrasound.

## 2023-10-14 NOTE — Addendum Note (Signed)
Addended by: Marinus Maw on: 10/14/2023 11:54 AM   Modules accepted: Orders

## 2023-10-14 NOTE — Telephone Encounter (Signed)
Orders placed.

## 2023-10-14 NOTE — Telephone Encounter (Signed)
Pt would like orders sent to [facility]. Looks like orders were placed by Dr. Yetta Barre, results stating need for diagnostic mammo orders under media tab ("further evaluation suggested for possible mass in left breast, diagnostic mammogram and possible ultrasound of left breast recommended")

## 2023-10-15 DIAGNOSIS — R928 Other abnormal and inconclusive findings on diagnostic imaging of breast: Secondary | ICD-10-CM | POA: Diagnosis not present

## 2023-10-15 DIAGNOSIS — N6323 Unspecified lump in the left breast, lower outer quadrant: Secondary | ICD-10-CM | POA: Diagnosis not present

## 2023-10-15 DIAGNOSIS — N6002 Solitary cyst of left breast: Secondary | ICD-10-CM | POA: Diagnosis not present

## 2023-10-15 DIAGNOSIS — R92322 Mammographic fibroglandular density, left breast: Secondary | ICD-10-CM | POA: Diagnosis not present

## 2023-10-16 ENCOUNTER — Encounter: Payer: Self-pay | Admitting: Internal Medicine

## 2023-12-22 ENCOUNTER — Encounter: Payer: Federal, State, Local not specified - PPO | Admitting: Family Medicine

## 2023-12-24 ENCOUNTER — Ambulatory Visit (INDEPENDENT_AMBULATORY_CARE_PROVIDER_SITE_OTHER): Payer: Federal, State, Local not specified - PPO | Admitting: Family Medicine

## 2023-12-24 ENCOUNTER — Other Ambulatory Visit (HOSPITAL_COMMUNITY)
Admission: RE | Admit: 2023-12-24 | Discharge: 2023-12-24 | Disposition: A | Discharge status: Home / Self Care | Source: Ambulatory Visit | Attending: Family Medicine | Admitting: Family Medicine

## 2023-12-24 ENCOUNTER — Encounter: Payer: Self-pay | Admitting: Family Medicine

## 2023-12-24 VITALS — BP 112/80 | HR 66 | Temp 98.0°F | Ht 61.0 in | Wt 158.0 lb

## 2023-12-24 DIAGNOSIS — E782 Mixed hyperlipidemia: Secondary | ICD-10-CM | POA: Diagnosis not present

## 2023-12-24 DIAGNOSIS — Z124 Encounter for screening for malignant neoplasm of cervix: Secondary | ICD-10-CM | POA: Diagnosis not present

## 2023-12-24 DIAGNOSIS — Z0184 Encounter for antibody response examination: Secondary | ICD-10-CM

## 2023-12-24 DIAGNOSIS — I10 Essential (primary) hypertension: Secondary | ICD-10-CM

## 2023-12-24 DIAGNOSIS — E559 Vitamin D deficiency, unspecified: Secondary | ICD-10-CM | POA: Diagnosis not present

## 2023-12-24 DIAGNOSIS — Z0000 Encounter for general adult medical examination without abnormal findings: Secondary | ICD-10-CM

## 2023-12-24 DIAGNOSIS — Z113 Encounter for screening for infections with a predominantly sexual mode of transmission: Secondary | ICD-10-CM | POA: Diagnosis not present

## 2023-12-24 DIAGNOSIS — Z1159 Encounter for screening for other viral diseases: Secondary | ICD-10-CM | POA: Diagnosis not present

## 2023-12-24 DIAGNOSIS — Z0001 Encounter for general adult medical examination with abnormal findings: Secondary | ICD-10-CM

## 2023-12-24 LAB — COMPREHENSIVE METABOLIC PANEL WITH GFR
ALT: 14 U/L (ref 0–35)
AST: 18 U/L (ref 0–37)
Albumin: 4.5 g/dL (ref 3.5–5.2)
Alkaline Phosphatase: 45 U/L (ref 39–117)
BUN: 16 mg/dL (ref 6–23)
CO2: 31 meq/L (ref 19–32)
Calcium: 9.1 mg/dL (ref 8.4–10.5)
Chloride: 101 meq/L (ref 96–112)
Creatinine, Ser: 0.83 mg/dL (ref 0.40–1.20)
GFR: 74.52 mL/min (ref >60.00)
Glucose, Bld: 99 mg/dL (ref 70–99)
Potassium: 3.7 meq/L (ref 3.5–5.1)
Sodium: 138 meq/L (ref 135–145)
Total Bilirubin: 0.3 mg/dL (ref 0.2–1.2)
Total Protein: 7.3 g/dL (ref 6.0–8.3)

## 2023-12-24 LAB — CBC WITH DIFFERENTIAL/PLATELET
Basophils Absolute: 0.1 10*3/uL (ref 0.0–0.1)
Basophils Relative: 0.7 % (ref 0.0–3.0)
Eosinophils Absolute: 0.3 10*3/uL (ref 0.0–0.7)
Eosinophils Relative: 3.9 % (ref 0.0–5.0)
HCT: 37.8 % (ref 36.0–46.0)
Hemoglobin: 12.6 g/dL (ref 12.0–15.0)
Lymphocytes Relative: 41.3 % (ref 12.0–46.0)
Lymphs Abs: 3.2 10*3/uL (ref 0.7–4.0)
MCHC: 33.5 g/dL (ref 30.0–36.0)
MCV: 93.5 fl (ref 78.0–100.0)
Monocytes Absolute: 0.7 10*3/uL (ref 0.1–1.0)
Monocytes Relative: 8.5 % (ref 3.0–12.0)
Neutro Abs: 3.6 10*3/uL (ref 1.4–7.7)
Neutrophils Relative %: 45.6 % (ref 43.0–77.0)
Platelets: 246 10*3/uL (ref 150.0–400.0)
RBC: 4.04 Mil/uL (ref 3.87–5.11)
RDW: 13.1 % (ref 11.5–15.5)
WBC: 7.8 10*3/uL (ref 4.0–10.5)

## 2023-12-24 LAB — LIPID PANEL
Cholesterol: 194 mg/dL (ref 0–200)
HDL: 55.9 mg/dL (ref >39.00)
LDL Cholesterol: 112 mg/dL — ABNORMAL HIGH (ref 0–99)
NonHDL: 137.63
Total CHOL/HDL Ratio: 3
Triglycerides: 129 mg/dL (ref 0.0–149.0)
VLDL: 25.8 mg/dL (ref 0.0–40.0)

## 2023-12-24 LAB — TSH: TSH: 4 u[IU]/mL (ref 0.35–5.50)

## 2023-12-24 NOTE — Progress Notes (Signed)
 Complete physical exam  Patient: Audrey Mendez   DOB: 1961/09/13   63 y.o. Female  MRN: [id number]  Subjective:    Chief Complaint  Patient presents with   Annual Exam    Fasting. Physical exam. Pt requests PAP smear    She is here for a complete physical exam.  She would like to know if she has immunity to MMR or have a vaccine.    Health Maintenance  Topic Date Due   HIV Screening  Never done   Hepatitis C Screening  Never done   Zoster (Shingles) Vaccine (1 of 2) 08/06/1978   Colon Cancer Screening  Never done   Pap with HPV screening  11/21/2022   COVID-19 Vaccine (6 - 2024-25 season) 05/17/2023   Flu Shot  04/15/2024   Mammogram  10/05/2025   DTaP/Tdap/Td vaccine (3 - Td or Tdap) 12/11/2031   HPV Vaccine  Aged Out   Meningitis B Vaccine  Aged Out    Wears seatbelt always, uses sunscreen, smoke detectors in home and functioning, does not text while driving, feels safe in home environment.  Depression screening:    06/03/2023    2:15 PM  Depression screen PHQ 2/9  Decreased Interest 0  Down, Depressed, Hopeless 0  PHQ - 2 Score 0   Anxiety Screening:     No data to display          Vision:Within last year and Dental: No current dental problems and Receives regular dental care  Patient Active Problem List   Diagnosis Date Noted   Chronic insomnia 06/03/2023   Pure hypercholesterolemia 06/03/2023   Situational stress 06/03/2023   Sleep disturbance 06/03/2023   Vitamin D deficiency 06/03/2023   Acute bilateral low back pain with left-sided sciatica 06/03/2023   Essential hypertension 04/29/2017   Past Medical History:  Diagnosis Date   Hyperlipidemia    Hypertension    History reviewed. No pertinent surgical history. Social History   Tobacco Use   Smoking status: Never   Smokeless tobacco: Never  Vaping Use   Vaping status: Never Used  Substance Use Topics   Alcohol use: No   Drug use: No      Patient Care Team: Avanell Shackleton,  NP-C as PCP - General (Family Medicine)   Outpatient Medications Prior to Visit  Medication Sig   Cholecalciferol (VITAMIN D) 2000 units CAPS Take 1 capsule by mouth daily.   hydrochlorothiazide (HYDRODIURIL) 25 MG tablet Take by mouth.   ketoconazole (NIZORAL) 2 % shampoo Apply 1 Application topically 2 (two) times a week.   lisinopril (ZESTRIL) 40 MG tablet Take 40 mg by mouth daily.   Multiple Vitamin (MULTIVITAMIN ADULT PO) Take by mouth.   No facility-administered medications prior to visit.    Review of Systems  Constitutional:  Negative for chills, fever, malaise/fatigue and weight loss.  HENT:  Negative for congestion, ear pain, sinus pain and sore throat.   Eyes:  Negative for blurred vision, double vision and pain.  Respiratory:  Negative for cough, shortness of breath and wheezing.   Cardiovascular:  Negative for chest pain, palpitations and leg swelling.  Gastrointestinal:  Negative for abdominal pain, constipation, diarrhea, nausea and vomiting.  Genitourinary:  Negative for dysuria, frequency and urgency.  Musculoskeletal:  Negative for back pain, joint pain and myalgias.  Skin:  Negative for rash.  Neurological:  Negative for dizziness, tingling, focal weakness and headaches.  Endo/Heme/Allergies:  Does not bruise/bleed easily.  Psychiatric/Behavioral:  Negative for depression, memory  loss and suicidal ideas. The patient is not nervous/anxious.        Objective:    BP 112/80 (BP Location: Left Arm, Patient Position: Sitting, Cuff Size: Normal)   Pulse 66   Temp 98 F (36.7 C) (Temporal)   Ht 5\' 1"  (1.549 m)   Wt 158 lb (71.7 kg)   SpO2 98%   BMI 29.85 kg/m  BP Readings from Last 3 Encounters:  12/24/23 112/80  10/08/23 96/62  06/03/23 120/76   Wt Readings from Last 3 Encounters:  12/24/23 158 lb (71.7 kg)  10/08/23 158 lb (71.7 kg)  06/03/23 161 lb (73 kg)    Physical Exam Exam conducted with a chaperone present.  Constitutional:      General: She  is not in acute distress.    Appearance: She is not ill-appearing.  HENT:     Right Ear: Tympanic membrane, ear canal and external ear normal.     Left Ear: Tympanic membrane, ear canal and external ear normal.     Nose: Nose normal.     Mouth/Throat:     Mouth: Mucous membranes are moist.     Pharynx: Oropharynx is clear.  Eyes:     Extraocular Movements: Extraocular movements intact.     Conjunctiva/sclera: Conjunctivae normal.     Pupils: Pupils are equal, round, and reactive to light.  Neck:     Thyroid: No thyroid mass, thyromegaly or thyroid tenderness.  Cardiovascular:     Rate and Rhythm: Normal rate and regular rhythm.     Pulses: Normal pulses.     Heart sounds: Normal heart sounds.  Pulmonary:     Effort: Pulmonary effort is normal.     Breath sounds: Normal breath sounds.  Abdominal:     General: Bowel sounds are normal.     Palpations: Abdomen is soft.     Tenderness: There is no abdominal tenderness. There is no right CVA tenderness, left CVA tenderness, guarding or rebound.  Genitourinary:    Labia:        Right: No tenderness or lesion.        Left: No tenderness or lesion.      Vagina: Normal.     Cervix: Normal.     Uterus: Normal.      Adnexa:        Right: No tenderness or fullness.         Left: No tenderness or fullness.       Comments: Pap smear done Musculoskeletal:        General: Normal range of motion.     Cervical back: Normal range of motion and neck supple. No tenderness.     Right lower leg: No edema.     Left lower leg: No edema.  Lymphadenopathy:     Cervical: No cervical adenopathy.  Skin:    General: Skin is warm and dry.     Findings: No lesion or rash.  Neurological:     General: No focal deficit present.     Mental Status: She is alert and oriented to person, place, and time.     Cranial Nerves: No cranial nerve deficit.     Sensory: No sensory deficit.     Motor: No weakness.     Gait: Gait normal.  Psychiatric:        Mood  and Affect: Mood normal.        Behavior: Behavior normal.        Thought Content: Thought content normal.  Results for orders placed or performed in visit on 12/24/23  TSH  Result Value Ref Range   TSH 4.00 0.35 - 5.50 uIU/mL  Lipid panel  Result Value Ref Range   Cholesterol 194 0 - 200 mg/dL   Triglycerides 132.4 0.0 - 149.0 mg/dL   HDL 40.10 >27.25 mg/dL   VLDL 36.6 0.0 - 44.0 mg/dL   LDL Cholesterol 347 (H) 0 - 99 mg/dL   Total CHOL/HDL Ratio 3    NonHDL 137.63   Comprehensive metabolic panel with GFR  Result Value Ref Range   Sodium 138 135 - 145 mEq/L   Potassium 3.7 3.5 - 5.1 mEq/L   Chloride 101 96 - 112 mEq/L   CO2 31 19 - 32 mEq/L   Glucose, Bld 99 70 - 99 mg/dL   BUN 16 6 - 23 mg/dL   Creatinine, Ser 4.25 0.40 - 1.20 mg/dL   Total Bilirubin 0.3 0.2 - 1.2 mg/dL   Alkaline Phosphatase 45 39 - 117 U/L   AST 18 0 - 37 U/L   ALT 14 0 - 35 U/L   Total Protein 7.3 6.0 - 8.3 g/dL   Albumin 4.5 3.5 - 5.2 g/dL   GFR 95.63 >87.56 mL/min   Calcium 9.1 8.4 - 10.5 mg/dL  CBC with Differential/Platelet  Result Value Ref Range   WBC 7.8 4.0 - 10.5 K/uL   RBC 4.04 3.87 - 5.11 Mil/uL   Hemoglobin 12.6 12.0 - 15.0 g/dL   HCT 43.3 29.5 - 18.8 %   MCV 93.5 78.0 - 100.0 fl   MCHC 33.5 30.0 - 36.0 g/dL   RDW 41.6 60.6 - 30.1 %   Platelets 246.0 150.0 - 400.0 K/uL   Neutrophils Relative % 45.6 43.0 - 77.0 %   Lymphocytes Relative 41.3 12.0 - 46.0 %   Monocytes Relative 8.5 3.0 - 12.0 %   Eosinophils Relative 3.9 0.0 - 5.0 %   Basophils Relative 0.7 0.0 - 3.0 %   Neutro Abs 3.6 1.4 - 7.7 K/uL   Lymphs Abs 3.2 0.7 - 4.0 K/uL   Monocytes Absolute 0.7 0.1 - 1.0 K/uL   Eosinophils Absolute 0.3 0.0 - 0.7 K/uL   Basophils Absolute 0.1 0.0 - 0.1 K/uL      Assessment & Plan:    Routine Health Maintenance and Physical Exam  Problem List Items Addressed This Visit     Essential hypertension   Relevant Orders   CBC with Differential/Platelet (Completed)   Comprehensive  metabolic panel with GFR (Completed)   TSH (Completed)   Vitamin D deficiency   Other Visit Diagnoses       Encounter for general adult medical examination with abnormal findings    -  Primary     Screening for cervical cancer       Relevant Orders   Cytology - PAP( [facility])     Encounter for screening for other viral diseases       Relevant Orders   Hepatitis C antibody   HIV Antibody (routine testing w rflx)     Mixed hyperlipidemia       Relevant Orders   Lipid panel (Completed)     Immunity status testing       Relevant Orders   Measles/Mumps/Rubella Immunity      Preventive health care reviewed.  Counseling on healthy lifestyle including diet and exercise.  Recommend regular dental and eye exams.  Immunizations reviewed.  Discussed safety. Screening Pap smear done per patient request.  Denies  recent abnormal Pap smears.  States her last one was 3 years ago.  Reports colon cancer screening up-to-date with [facility].  Mammogram up-to-date.  She requests MMR status testing. Cancelled MMR titer and she will check with her pharmacy regarding a booster.   Return in about 6 months (around 06/24/2024).     Hetty Blend, NP-C

## 2023-12-24 NOTE — Addendum Note (Signed)
 Addended by: Avanell Shackleton on: 12/24/2023 10:23 AM   Modules accepted: Orders

## 2023-12-24 NOTE — Patient Instructions (Signed)
 Please go downstairs for labs before you leave.  Be sure you are getting adequate calcium in your diet, 1200 mg daily as recommended, taking a vitamin D supplement and getting at least 150 minutes of physical activity each week.  Be sure to get regular dental and eye exams.  If you have not already had the shingles vaccines then I recommend that you get these at your pharmacy.  It is a 2 shot series.

## 2023-12-25 LAB — HIV ANTIBODY (ROUTINE TESTING W REFLEX): HIV 1&2 Ab, 4th Generation: NONREACTIVE

## 2023-12-25 LAB — HEPATITIS C ANTIBODY: Hepatitis C Ab: NONREACTIVE

## 2023-12-27 ENCOUNTER — Encounter: Payer: Self-pay | Admitting: Family Medicine

## 2023-12-29 LAB — CYTOLOGY - PAP
Chlamydia: NEGATIVE
Comment: NEGATIVE
Comment: NEGATIVE
Comment: NEGATIVE
Comment: NORMAL
Diagnosis: NEGATIVE
High risk HPV: NEGATIVE
Neisseria Gonorrhea: NEGATIVE
Trichomonas: NEGATIVE

## 2024-02-01 ENCOUNTER — Encounter: Payer: Self-pay | Admitting: Family Medicine

## 2024-02-02 DIAGNOSIS — Z85820 Personal history of malignant melanoma of skin: Secondary | ICD-10-CM | POA: Diagnosis not present

## 2024-02-02 DIAGNOSIS — Z08 Encounter for follow-up examination after completed treatment for malignant neoplasm: Secondary | ICD-10-CM | POA: Diagnosis not present

## 2024-02-02 DIAGNOSIS — L718 Other rosacea: Secondary | ICD-10-CM | POA: Diagnosis not present

## 2024-02-02 DIAGNOSIS — L821 Other seborrheic keratosis: Secondary | ICD-10-CM | POA: Diagnosis not present

## 2024-04-16 ENCOUNTER — Encounter: Payer: Self-pay | Admitting: Family Medicine

## 2024-04-18 MED ORDER — LISINOPRIL 40 MG PO TABS: 40.0000 mg | ORAL_TABLET | Freq: Every day | ORAL | 0 refills | Status: DC

## 2024-04-18 MED ORDER — HYDROCHLOROTHIAZIDE 25 MG PO TABS: ORAL_TABLET | ORAL | 0 refills | Status: DC

## 2024-05-11 ENCOUNTER — Other Ambulatory Visit: Payer: Self-pay | Admitting: Family Medicine

## 2024-06-24 ENCOUNTER — Ambulatory Visit: Admitting: Family Medicine

## 2024-06-24 ENCOUNTER — Encounter: Payer: Self-pay | Admitting: Family Medicine

## 2024-06-24 ENCOUNTER — Ambulatory Visit: Payer: Self-pay | Admitting: Family Medicine

## 2024-06-24 VITALS — BP 118/72 | HR 75 | Temp 97.6°F | Ht 61.0 in | Wt 152.0 lb

## 2024-06-24 DIAGNOSIS — Z23 Encounter for immunization: Secondary | ICD-10-CM

## 2024-06-24 DIAGNOSIS — I10 Essential (primary) hypertension: Secondary | ICD-10-CM

## 2024-06-24 DIAGNOSIS — E7800 Pure hypercholesterolemia, unspecified: Secondary | ICD-10-CM | POA: Diagnosis not present

## 2024-06-24 DIAGNOSIS — Z136 Encounter for screening for cardiovascular disorders: Secondary | ICD-10-CM | POA: Diagnosis not present

## 2024-06-24 LAB — CBC WITH DIFFERENTIAL/PLATELET
Basophils Absolute: 0.1 K/uL (ref 0.0–0.1)
Basophils Relative: 0.7 % (ref 0.0–3.0)
Eosinophils Absolute: 0.2 K/uL (ref 0.0–0.7)
Eosinophils Relative: 2.7 % (ref 0.0–5.0)
HCT: 38.6 % (ref 36.0–46.0)
Hemoglobin: 12.9 g/dL (ref 12.0–15.0)
Lymphocytes Relative: 38.7 % (ref 12.0–46.0)
Lymphs Abs: 2.9 K/uL (ref 0.7–4.0)
MCHC: 33.3 g/dL (ref 30.0–36.0)
MCV: 91.4 fl (ref 78.0–100.0)
Monocytes Absolute: 0.4 K/uL (ref 0.1–1.0)
Monocytes Relative: 5.7 % (ref 3.0–12.0)
Neutro Abs: 3.9 K/uL (ref 1.4–7.7)
Neutrophils Relative %: 52.2 % (ref 43.0–77.0)
Platelets: 236 K/uL (ref 150.0–400.0)
RBC: 4.23 Mil/uL (ref 3.87–5.11)
RDW: 13.5 % (ref 11.5–15.5)
WBC: 7.4 K/uL (ref 4.0–10.5)

## 2024-06-24 LAB — BASIC METABOLIC PANEL WITH GFR
BUN: 18 mg/dL (ref 6–23)
CO2: 31 meq/L (ref 19–32)
Calcium: 9.3 mg/dL (ref 8.4–10.5)
Chloride: 100 meq/L (ref 96–112)
Creatinine, Ser: 0.7 mg/dL (ref 0.40–1.20)
GFR: 91.1 mL/min (ref >60.00)
Glucose, Bld: 103 mg/dL — ABNORMAL HIGH (ref 70–99)
Potassium: 3.7 meq/L (ref 3.5–5.1)
Sodium: 139 meq/L (ref 135–145)

## 2024-06-24 MED ORDER — COVID-19 MRNA VACC (MODERNA) 50 MCG/0.5ML IM SUSP: 0.5000 mL | Freq: Once | INTRAMUSCULAR | 0 refills | Status: AC

## 2024-06-24 MED ORDER — LISINOPRIL 40 MG PO TABS: 40.0000 mg | ORAL_TABLET | Freq: Every day | ORAL | 1 refills | Status: AC

## 2024-06-24 MED ORDER — COVID-19 MRNA VACC (MODERNA) 50 MCG/0.5ML IM SUSP: 0.5000 mL | Freq: Once | INTRAMUSCULAR | 0 refills | Status: DC

## 2024-06-24 MED ORDER — HYDROCHLOROTHIAZIDE 25 MG PO TABS: 25.0000 mg | ORAL_TABLET | Freq: Every day | ORAL | 1 refills | Status: AC

## 2024-06-24 NOTE — Patient Instructions (Signed)
 You will be contacted to schedule the CT Coronary Calcium Test

## 2024-06-24 NOTE — Progress Notes (Signed)
 Subjective:     Patient ID: Audrey Mendez, female    DOB: 1961-05-09, 63 y.o.   MRN: [id number]  Chief Complaint  Patient presents with   Medical Management of Chronic Issues    6 month f/u    HPI  Discussed the use of AI scribe software for clinical note transcription with the patient, who gave verbal consent to proceed.  History of Present Illness Audrey Mendez is a 63 year old female with hypertension who presents for a six-month follow-up on hypertension and other chronic health conditions.  Hypertension - On antihypertensive medication - No current symptoms related to hypertension - No personal history of heart disease  Diabetes mellitus - Diabetes present; no acute symptoms reported  Weight loss - Lost 6 pounds since May 2025 - Weight loss associated with decreased stress after husband left  Psychosocial well-being - Decreased stress since May 2025 - Supportive social network including church and Bible study group - Acquired a puppy, which provides comfort   - Brother died suddenly at age 22, unknown event       Health Maintenance Due  Topic Date Due   Zoster Vaccines- Shingrix (1 of 2) 08/06/1978   Pneumococcal Vaccine: 50+ Years (1 of 1 - PCV) Never done    Past Medical History:  Diagnosis Date   Hyperlipidemia    Hypertension     History reviewed. No pertinent surgical history.  Family History  Problem Relation Age of Onset   Heart failure Mother    Hypertension Mother    Atrial fibrillation Mother    Heart failure Father    Heart attack Brother     Social History   Socioeconomic History   Marital status: Married    Spouse name: Not on file   Number of children: Not on file   Years of education: Not on file   Highest education level: Bachelor's degree (e.g., BA, AB, BS)  Occupational History   Not on file  Tobacco Use   Smoking status: Never   Smokeless tobacco: Never  Vaping Use   Vaping status: Never Used  Substance and  Sexual Activity   Alcohol use: No   Drug use: No   Sexual activity: Not Currently    Birth control/protection: Post-menopausal  Other Topics Concern   Not on file  Social History Narrative   Not on file   Social Drivers of Health   Financial Resource Strain: Low Risk  (12/23/2023)   Overall Financial Resource Strain (CARDIA)    Difficulty of Paying Living Expenses: Not very hard  Food Insecurity: No Food Insecurity (12/23/2023)   Hunger Vital Sign    Worried About Running Out of Food in the Last Year: Never true    Ran Out of Food in the Last Year: Never true  Transportation Needs: No Transportation Needs (12/23/2023)   PRAPARE - Administrator, Civil Service (Medical): No    Lack of Transportation (Non-Medical): No  Physical Activity: Sufficiently Active (12/23/2023)   Exercise Vital Sign    Days of Exercise per Week: 6 days    Minutes of Exercise per Session: 60 min  Stress: Not on file  Social Connections: Socially Integrated (12/23/2023)   Social Connection and Isolation Panel    Frequency of Communication with Friends and Family: Three times a week    Frequency of Social Gatherings with Friends and Family: Twice a week    Attends Religious Services: More than 4 times per year    Active  Member of Clubs or Organizations: Yes    Attends Engineer, structural: More than 4 times per year    Marital Status: Married  Catering manager Violence: Not on file    Outpatient Medications Prior to Visit  Medication Sig Dispense Refill   Cholecalciferol (VITAMIN D ) 2000 units CAPS Take 1 capsule by mouth daily.     ketoconazole (NIZORAL) 2 % shampoo Apply 1 Application topically 2 (two) times a week.     Multiple Vitamin (MULTIVITAMIN ADULT PO) Take by mouth.     hydrochlorothiazide  (HYDRODIURIL ) 25 MG tablet TAKE 1 TABLET BY MOUTH EVERY DAY 90 tablet 0   lisinopril  (ZESTRIL ) 40 MG tablet TAKE 1 TABLET BY MOUTH EVERY DAY 90 tablet 0   No facility-administered medications  prior to visit.    Allergies  Allergen Reactions   Codeine Other (See Comments) and Nausea Only    Nausea and Vomiting   Sulfur Colloidal [Elemental Sulfur]    Penicillins Rash     rash when a child   Sulfa Antibiotics Rash   Zoloft [Sertraline Hcl]     Review of Systems  Constitutional:  Positive for weight loss. Negative for chills, fever and malaise/fatigue.  Respiratory:  Negative for shortness of breath.   Cardiovascular:  Negative for chest pain, palpitations and leg swelling.  Gastrointestinal:  Negative for abdominal pain, constipation, diarrhea, nausea and vomiting.  Genitourinary:  Negative for dysuria, frequency and urgency.  Neurological:  Negative for dizziness, focal weakness and headaches.  Psychiatric/Behavioral:  Negative for depression. The patient is not nervous/anxious.        Objective:    Physical Exam Constitutional:      General: She is not in acute distress.    Appearance: She is not ill-appearing.  Eyes:     Extraocular Movements: Extraocular movements intact.     Conjunctiva/sclera: Conjunctivae normal.  Cardiovascular:     Rate and Rhythm: Normal rate.  Pulmonary:     Effort: Pulmonary effort is normal.  Musculoskeletal:     Cervical back: Normal range of motion and neck supple.  Skin:    General: Skin is warm and dry.  Neurological:     General: No focal deficit present.     Mental Status: She is alert and oriented to person, place, and time.  Psychiatric:        Mood and Affect: Mood normal.        Behavior: Behavior normal.        Thought Content: Thought content normal.      BP 118/72   Pulse 75   Temp 97.6 F (36.4 C) (Temporal)   Ht 5' 1 (1.549 m)   Wt 152 lb (68.9 kg)   SpO2 99%   BMI 28.72 kg/m  Wt Readings from Last 3 Encounters:  06/24/24 152 lb (68.9 kg)  12/24/23 158 lb (71.7 kg)  10/08/23 158 lb (71.7 kg)       Assessment & Plan:   Problem List Items Addressed This Visit     Essential hypertension -  Primary   Relevant Medications   lisinopril  (ZESTRIL ) 40 MG tablet   hydrochlorothiazide  (HYDRODIURIL ) 25 MG tablet   Other Relevant Orders   Basic metabolic panel with GFR (Completed)   CBC with Differential/Platelet (Completed)   Pure hypercholesterolemia   Relevant Medications   lisinopril  (ZESTRIL ) 40 MG tablet   hydrochlorothiazide  (HYDRODIURIL ) 25 MG tablet   Other Relevant Orders   CT CARDIAC SCORING (SELF PAY ONLY)   Other Visit  Diagnoses       Screening for ischemic heart disease       Relevant Orders   CT CARDIAC SCORING (SELF PAY ONLY)     Need for influenza vaccination       Relevant Orders   Flu vaccine trivalent PF, 6mos and older(Flulaval,Afluria,Fluarix,Fluzone) (Completed)       Assessment and Plan Assessment & Plan Essential hypertension Blood pressure is well-controlled. Experiencing significant life stress due to personal circumstances, but reports feeling less stressed since separation from husband. - Refill antihypertensive medication for six months - Check kidney function and electrolytes to monitor for medication effects  Mixed hyperlipidemia LDL slightly elevated, but overall risk factor for heart disease does not indicate the need for cholesterol medication. No family history of early heart attack or stroke, but brother died suddenly at 55.  Discussed CT coronary calcium test as an option to assess cardiovascular risk, noting it costs $99 out of pocket and is not covered by insurance. - Order CT coronary calcium test to assess for blockages and further evaluate cardiovascular risk     I have changed Audrey Mendez's lisinopril  and hydrochlorothiazide . I am also having her maintain her Vitamin D , ketoconazole, Multiple Vitamin (MULTIVITAMIN ADULT PO), and (COVID-19 mRNA vaccine (Moderna, >/= 24yrs)).  Meds ordered this encounter  Medications   lisinopril  (ZESTRIL ) 40 MG tablet    Sig: Take 1 tablet (40 mg total) by mouth daily.    Dispense:  90  tablet    Refill:  1   hydrochlorothiazide  (HYDRODIURIL ) 25 MG tablet    Sig: Take 1 tablet (25 mg total) by mouth daily.    Dispense:  90 tablet    Refill:  1   DISCONTD: COVID-19 mRNA vaccine, Moderna, >/= 43yrs, (SPIKEVAX) injection    Sig: Inject 0.5 mLs into the muscle once for 1 dose.    Dispense:  0.5 mL    Refill:  0    Can substitute per patient preference/availability   COVID-19 mRNA vaccine, Moderna, >/= 33yrs, (SPIKEVAX) injection    Sig: Inject 0.5 mLs into the muscle once for 1 dose.    Dispense:  0.5 mL    Refill:  0    Can substitute per patient preference/availability

## 2024-07-01 ENCOUNTER — Ambulatory Visit (HOSPITAL_BASED_OUTPATIENT_CLINIC_OR_DEPARTMENT_OTHER)
Admission: RE | Admit: 2024-07-01 | Discharge: 2024-07-01 | Disposition: A | Discharge status: Home / Self Care | Payer: Self-pay | Source: Ambulatory Visit | Attending: Family Medicine | Admitting: Family Medicine

## 2024-07-01 DIAGNOSIS — Z136 Encounter for screening for cardiovascular disorders: Secondary | ICD-10-CM | POA: Insufficient documentation

## 2024-07-01 DIAGNOSIS — E7800 Pure hypercholesterolemia, unspecified: Secondary | ICD-10-CM | POA: Insufficient documentation

## 2024-07-04 NOTE — Progress Notes (Signed)
 Please call patient and let her know that her CT coronary calcium scan showed aortic atherosclerosis (plaque buildup) which can lead to heart attack and stroke. Her calcium score was 275 which is the 92nd percentile for age, race, and sex. This is considered to be quite high. It is recommended that she see cardiology and start on statin therapy. Has she taken Crestor, Lipitor, Simvastatin or any other cholesterol medication in the past? Is she ok with starting on a statin and seeing a cardiologist?

## 2024-07-05 ENCOUNTER — Other Ambulatory Visit: Payer: Self-pay | Admitting: Family Medicine

## 2024-07-05 DIAGNOSIS — E782 Mixed hyperlipidemia: Secondary | ICD-10-CM

## 2024-07-05 DIAGNOSIS — R931 Abnormal findings on diagnostic imaging of heart and coronary circulation: Secondary | ICD-10-CM | POA: Insufficient documentation

## 2024-07-05 DIAGNOSIS — I700 Atherosclerosis of aorta: Secondary | ICD-10-CM | POA: Insufficient documentation

## 2024-07-05 MED ORDER — ATORVASTATIN CALCIUM 20 MG PO TABS: 20.0000 mg | ORAL_TABLET | Freq: Every day | ORAL | 1 refills | Status: AC

## 2024-07-08 ENCOUNTER — Ambulatory Visit: Admitting: Family Medicine

## 2024-08-10 ENCOUNTER — Other Ambulatory Visit: Payer: Self-pay | Admitting: Family Medicine

## 2024-08-10 DIAGNOSIS — Z1231 Encounter for screening mammogram for malignant neoplasm of breast: Secondary | ICD-10-CM

## 2024-08-25 ENCOUNTER — Ambulatory Visit: Payer: Self-pay | Admitting: Family Medicine

## 2024-08-25 ENCOUNTER — Ambulatory Visit: Admitting: Family Medicine

## 2024-08-25 ENCOUNTER — Encounter: Payer: Self-pay | Admitting: Family Medicine

## 2024-08-25 VITALS — BP 124/72 | HR 80 | Temp 97.8°F | Ht 61.0 in | Wt 155.0 lb

## 2024-08-25 DIAGNOSIS — E7800 Pure hypercholesterolemia, unspecified: Secondary | ICD-10-CM

## 2024-08-25 DIAGNOSIS — R931 Abnormal findings on diagnostic imaging of heart and coronary circulation: Secondary | ICD-10-CM

## 2024-08-25 DIAGNOSIS — I700 Atherosclerosis of aorta: Secondary | ICD-10-CM

## 2024-08-25 DIAGNOSIS — Z833 Family history of diabetes mellitus: Secondary | ICD-10-CM

## 2024-08-25 DIAGNOSIS — I10 Essential (primary) hypertension: Secondary | ICD-10-CM

## 2024-08-25 LAB — COMPREHENSIVE METABOLIC PANEL WITH GFR
ALT: 17 U/L (ref 0–35)
AST: 22 U/L (ref 0–37)
Albumin: 4.5 g/dL (ref 3.5–5.2)
Alkaline Phosphatase: 53 U/L (ref 39–117)
BUN: 16 mg/dL (ref 6–23)
CO2: 32 meq/L (ref 19–32)
Calcium: 9.7 mg/dL (ref 8.4–10.5)
Chloride: 99 meq/L (ref 96–112)
Creatinine, Ser: 0.79 mg/dL (ref 0.40–1.20)
GFR: 78.7 mL/min (ref >60.00)
Glucose, Bld: 99 mg/dL (ref 70–99)
Potassium: 3.7 meq/L (ref 3.5–5.1)
Sodium: 139 meq/L (ref 135–145)
Total Bilirubin: 0.5 mg/dL (ref 0.2–1.2)
Total Protein: 7.5 g/dL (ref 6.0–8.3)

## 2024-08-25 LAB — LIPID PANEL
Cholesterol: 130 mg/dL (ref 0–200)
HDL: 55.7 mg/dL (ref >39.00)
LDL Cholesterol: 57 mg/dL (ref 0–99)
NonHDL: 74.58
Total CHOL/HDL Ratio: 2
Triglycerides: 88 mg/dL (ref 0.0–149.0)
VLDL: 17.6 mg/dL (ref 0.0–40.0)

## 2024-08-25 LAB — HEMOGLOBIN A1C: Hgb A1c MFr Bld: 5.9 % (ref 4.6–6.5)

## 2024-08-25 NOTE — Patient Instructions (Addendum)
 Please go downstairs for labs before you leave.  Continue eating a low-fat diet and taking your cholesterol medication.  Please call [phone] to make an appointment at [facility] (cardiology)

## 2024-08-25 NOTE — Progress Notes (Signed)
 Subjective:     Patient ID: Audrey Mendez, female    DOB: 1960/09/29, 63 y.o.   MRN: [id number]  Chief Complaint  Patient presents with   Follow-up    6 week f/u, fasting    HPI  Discussed the use of AI scribe software for clinical note transcription with the patient, who gave verbal consent to proceed.  History of Present Illness Audrey Mendez is a 63 year old female with hyperlipidemia and aortic atherosclerosis who presents for follow-up on elevated CT coronary calcium .  Hyperlipidemia and atherosclerotic cardiovascular disease risk - Started cholesterol medication in October and has been taking it daily - Elevated CT coronary calcium  score on recent imaging - No chest pain, palpitations, or shortness of breath - Engages in 45 to 60 minutes of physical activity every morning     The 10-year ASCVD risk score (Arnett DK, et al., 2019) is: 5.6%   Values used to calculate the score:     Age: 70 years     Clinically relevant sex: Female     Is Non-Hispanic African American: No     Diabetic: No     Tobacco smoker: No     Systolic Blood Pressure: 124 mmHg     Is BP treated: Yes     HDL Cholesterol: 55.7 mg/dL     Total Cholesterol: 130 mg/dL   Health Maintenance Due  Topic Date Due   Medicare Annual Wellness (AWV)  Never done   Zoster Vaccines- Shingrix (1 of 2) 08/06/1978   Pneumococcal Vaccine: 50+ Years (1 of 1 - PCV) Never done   COVID-19 Vaccine (8 - 2025-26 season) 05/16/2024   Bone Density Scan  Never done    Past Medical History:  Diagnosis Date   Hyperlipidemia    Hypertension     History reviewed. No pertinent surgical history.  Family History  Problem Relation Age of Onset   Heart failure Mother    Hypertension Mother    Atrial fibrillation Mother    Heart failure Father    Heart attack Brother     Social History   Socioeconomic History   Marital status: Married    Spouse name: Not on file   Number of children: Not on file   Years of  education: Not on file   Highest education level: Bachelor's degree (e.g., BA, AB, BS)  Occupational History   Not on file  Tobacco Use   Smoking status: Never   Smokeless tobacco: Never  Vaping Use   Vaping status: Never Used  Substance and Sexual Activity   Alcohol use: No   Drug use: No   Sexual activity: Not Currently    Birth control/protection: Post-menopausal  Other Topics Concern   Not on file  Social History Narrative   Not on file   Social Drivers of Health   Tobacco Use: Low Risk (08/25/2024)   Patient History    Smoking Tobacco Use: Never    Smokeless Tobacco Use: Never    Passive Exposure: Not on file  Financial Resource Strain: Low Risk (12/23/2023)   Overall Financial Resource Strain (CARDIA)    Difficulty of Paying Living Expenses: Not very hard  Food Insecurity: No Food Insecurity (12/23/2023)   Hunger Vital Sign    Worried About Running Out of Food in the Last Year: Never true    Ran Out of Food in the Last Year: Never true  Transportation Needs: No Transportation Needs (12/23/2023)   PRAPARE - Transportation    Lack  of Transportation (Medical): No    Lack of Transportation (Non-Medical): No  Physical Activity: Sufficiently Active (12/23/2023)   Exercise Vital Sign    Days of Exercise per Week: 6 days    Minutes of Exercise per Session: 60 min  Stress: Not on file  Social Connections: Socially Integrated (12/23/2023)   Social Connection and Isolation Panel    Frequency of Communication with Friends and Family: Three times a week    Frequency of Social Gatherings with Friends and Family: Twice a week    Attends Religious Services: More than 4 times per year    Active Member of [employer] or Organizations: Yes    Attends Engineer, Structural: More than 4 times per year    Marital Status: Married  Catering Manager Violence: Not on file  Depression (PHQ2-9): Low Risk (06/24/2024)   Depression (PHQ2-9)    PHQ-2 Score: 0  Alcohol Screen: Not on file   Housing: Low Risk (12/23/2023)   Housing Stability Vital Sign    Unable to Pay for Housing in the Last Year: No    Number of Times Moved in the Last Year: 0    Homeless in the Last Year: No  Utilities: Not on file  Health Literacy: Not on file    Outpatient Medications Prior to Visit  Medication Sig Dispense Refill   atorvastatin  (LIPITOR) 20 MG tablet Take 1 tablet (20 mg total) by mouth daily. 90 tablet 1   Cholecalciferol (VITAMIN D ) 2000 units CAPS Take 1 capsule by mouth daily.     hydrochlorothiazide  (HYDRODIURIL ) 25 MG tablet Take 1 tablet (25 mg total) by mouth daily. 90 tablet 1   ketoconazole (NIZORAL) 2 % shampoo Apply 1 Application topically 2 (two) times a week.     lisinopril  (ZESTRIL ) 40 MG tablet Take 1 tablet (40 mg total) by mouth daily. 90 tablet 1   Multiple Vitamin (MULTIVITAMIN ADULT PO) Take by mouth.     No facility-administered medications prior to visit.    Allergies[1]  Review of Systems  Constitutional:  Negative for chills, fever and malaise/fatigue.  Respiratory:  Negative for shortness of breath.   Cardiovascular:  Negative for chest pain, palpitations and leg swelling.  Gastrointestinal:  Negative for abdominal pain, constipation, diarrhea, nausea and vomiting.  Neurological:  Negative for dizziness, focal weakness and headaches.       Objective:    Physical Exam Constitutional:      General: She is not in acute distress.    Appearance: She is not ill-appearing.  Eyes:     Extraocular Movements: Extraocular movements intact.     Conjunctiva/sclera: Conjunctivae normal.  Cardiovascular:     Rate and Rhythm: Normal rate.  Pulmonary:     Effort: Pulmonary effort is normal.  Musculoskeletal:     Cervical back: Normal range of motion and neck supple.  Skin:    General: Skin is warm and dry.  Neurological:     General: No focal deficit present.     Mental Status: She is alert and oriented to person, place, and time.  Psychiatric:         Mood and Affect: Mood normal.        Behavior: Behavior normal.        Thought Content: Thought content normal.      BP 124/72   Pulse 80   Temp 97.8 F (36.6 C) (Temporal)   Ht 5' 1 (1.549 m)   Wt 155 lb (70.3 kg)   SpO2 97%  BMI 29.29 kg/m  Wt Readings from Last 3 Encounters:  08/25/24 155 lb (70.3 kg)  06/24/24 152 lb (68.9 kg)  12/24/23 158 lb (71.7 kg)       Assessment & Plan:   Problem List Items Addressed This Visit     Agatston coronary artery calcium  score between 200 and 399 - Primary   Relevant Orders   Comprehensive metabolic panel with GFR (Completed)   Lipid panel (Completed)   Lipoprotein A (LPA)   Aortic atherosclerosis   Relevant Orders   Lipid panel (Completed)   Lipoprotein A (LPA)   Essential hypertension   Relevant Orders   Comprehensive metabolic panel with GFR (Completed)   Pure hypercholesterolemia   Relevant Orders   Comprehensive metabolic panel with GFR (Completed)   Lipid panel (Completed)   Lipoprotein A (LPA)   Other Visit Diagnoses       Family history of diabetes mellitus in first degree relative       Relevant Orders   Hemoglobin A1c (Completed)       Assessment and Plan Assessment & Plan Aortic atherosclerosis with elevated coronary calcium  score Aortic atherosclerosis with plaque buildup and elevated coronary calcium  score, indicating increased risk of blockages. No current symptoms such as chest pain, palpitations, or shortness of breath. Active lifestyle maintained with 45 minutes to an hour of activity every morning. Family history of early death due to diabetes, but no personal history of diabetes or prediabetes. - Provided cardiology referral phone number and instructed to schedule an appointment. - Checked cholesterol levels today. - Checked liver function tests to monitor for potential statin side effects.  Pure hypercholesterolemia Hypercholesterolemia managed with statin therapy since October. Cholesterol  levels need to be lowered further due to elevated coronary calcium  score. No prior history of cholesterol medication use before October. Diet and genetics may contribute to cholesterol levels. - Continue statin therapy. - Checked cholesterol levels today.     I am having Romero Rana maintain her Vitamin D , ketoconazole, Multiple Vitamin (MULTIVITAMIN ADULT PO), lisinopril , hydrochlorothiazide , and atorvastatin .  No orders of the defined types were placed in this encounter.      [1]  Allergies Allergen Reactions   Codeine Other (See Comments) and Nausea Only    Nausea and Vomiting   Sulfur Colloidal [Elemental Sulfur]    Penicillins Rash     rash when a child   Sulfa Antibiotics Rash   Zoloft [Sertraline Hcl]

## 2024-08-26 ENCOUNTER — Encounter: Payer: Self-pay | Admitting: Cardiology

## 2024-08-26 ENCOUNTER — Ambulatory Visit: Attending: Cardiology | Admitting: Cardiology

## 2024-08-26 VITALS — BP 126/68 | HR 80 | Ht 61.0 in | Wt 155.0 lb

## 2024-08-26 DIAGNOSIS — I700 Atherosclerosis of aorta: Secondary | ICD-10-CM | POA: Diagnosis not present

## 2024-08-26 DIAGNOSIS — R931 Abnormal findings on diagnostic imaging of heart and coronary circulation: Secondary | ICD-10-CM | POA: Diagnosis not present

## 2024-08-26 DIAGNOSIS — R072 Precordial pain: Secondary | ICD-10-CM

## 2024-08-26 DIAGNOSIS — E782 Mixed hyperlipidemia: Secondary | ICD-10-CM | POA: Diagnosis not present

## 2024-08-26 DIAGNOSIS — I10 Essential (primary) hypertension: Secondary | ICD-10-CM | POA: Diagnosis not present

## 2024-08-26 MED ORDER — ASPIRIN 81 MG PO TBEC: 81.0000 mg | DELAYED_RELEASE_TABLET | Freq: Every day | ORAL | Status: AC

## 2024-08-26 NOTE — Progress Notes (Signed)
 Cardiology Office Note:    Date:  08/26/2024  NAME:  Audrey Mendez    MRN: [id number] DOB:  Dec 17, 1960   PCP:  Lendia Boby CROME, NP-C  Former Cardiology Providers: None Primary Cardiologist:  Madonna Large, DO, [facility] (established care 08/26/2024) Electrophysiologist:  None   Referring MD: Lendia Boby CROME, NP-C  Reason of Consult: Coronary artery calcification, aortic atherosclerosis  Chief Complaint  Patient presents with   New Patient (Initial Visit)   Chest Pain    Pressure.    History of Present Illness:    Audrey Mendez is a 63 y.o. Caucasian female whose past medical history and cardiovascular risk factors includes: Coronary calcification, hyperlipidemia, aortic atherosclerosis, hypertension.   She is being seen today for the evaluation of coronary calcification/aortic atherosclerosis at the request of Henson, Vickie L, NP-C.  Patient is accompanied by her friend Luke at today's office visit and patient provides verbal consent with having her present.  Coronary artery calcium  scoring was performed under the guidance of her PCP due to family history of heart disease and patient's hyperlipidemia.  Patient was noted to have moderate CAC referred to cardiology for further evaluation and management.  Review of systems positive for chest pain.  Chest Pain Onset: last month Last occurrence: today Location: left chest, like a pull,  Intensity: 1/10 Frequency: randomly Duration:  seconds Improving factors: none Worsening factors: none Do symptoms worsen with effort related activities: No Do symptoms improve with resting or sublingual nitroglycerin tabs: No Are symptoms self limited: Yes Are symptoms positional / pleuritic: no  Exercise: Walks 45-60 minutes/day  Mom and Dad had CHF.  Older Brother - passed away at the age of 24 (cause unknown - presumed cardiac - autopsy was done and patient states cause was heart attack)   Current Medications: Active Medications[1]    Allergies:    Codeine, Sulfur colloidal [elemental sulfur], Penicillins, Sulfa antibiotics, and Zoloft [sertraline hcl]   Past Medical History: Past Medical History:  Diagnosis Date   Atherosclerosis of aorta    Coronary atherosclerosis due to calcified coronary lesion    Hyperlipidemia    Hypertension     Past Surgical History: History reviewed. No pertinent surgical history.  Social History: No ETOH, drugs, or illicit drugs   Family History: Family History  Problem Relation Age of Onset   Heart failure Mother    Hypertension Mother    Atrial fibrillation Mother    Heart failure Father    Heart attack Brother     ROS:   Review of Systems  Cardiovascular:  Positive for chest pain. Negative for claudication, irregular heartbeat, leg swelling, near-syncope, orthopnea, palpitations, paroxysmal nocturnal dyspnea and syncope.  Respiratory:  Negative for shortness of breath.   Hematologic/Lymphatic: Negative for bleeding problem.    Studies Reviewed:   EKG: EKG Interpretation Date/Time:  Friday August 26 2024 14:11:47 EST Ventricular Rate:  80 PR Interval:  158 QRS Duration:  70 QT Interval:  368 QTC Calculation: 424 R Axis:   12  Text Interpretation: Normal sinus rhythm Cannot rule out Anterior infarct , age undetermined When compared with ECG of 04-May-2017 11:42, Minimal criteria for Anterior infarct are now Present Confirmed by Large Madonna ([id number]) on 08/26/2024 2:39:54 PM  Echocardiogram: None  Stress Testing:  Nuclear stress test August 2018: Low risk study  Coronary calcium  score: October 2025 Total CAC 275 (all within the LAD), 92nd percentile Aortic atherosclerosis   Labs:    Latest Ref Rng & Units 06/24/2024  11:49 AM 12/24/2023   10:33 AM 06/03/2023    2:46 PM  CBC  WBC 4.0 - 10.5 K/uL 7.4  7.8  9.2   Hemoglobin 12.0 - 15.0 g/dL 87.0  87.3  87.0   Hematocrit 36.0 - 46.0 % 38.6  37.8  39.3   Platelets 150.0 - 400.0 K/uL 236.0  246.0   246.0        Latest Ref Rng & Units 08/25/2024   11:33 AM 06/24/2024   11:49 AM 12/24/2023   10:33 AM  BMP  Glucose 70 - 99 mg/dL 99  896  99   BUN 6 - 23 mg/dL 16  18  16    Creatinine 0.40 - 1.20 mg/dL 9.20  9.29  9.16   Sodium 135 - 145 mEq/L 139  139  138   Potassium 3.5 - 5.1 mEq/L 3.7  3.7  3.7   Chloride 96 - 112 mEq/L 99  100  101   CO2 19 - 32 mEq/L 32  31  31   Calcium  8.4 - 10.5 mg/dL 9.7  9.3  9.1       Latest Ref Rng & Units 08/25/2024   11:33 AM 06/24/2024   11:49 AM 12/24/2023   10:33 AM  CMP  Glucose 70 - 99 mg/dL 99  896  99   BUN 6 - 23 mg/dL 16  18  16    Creatinine 0.40 - 1.20 mg/dL 9.20  9.29  9.16   Sodium 135 - 145 mEq/L 139  139  138   Potassium 3.5 - 5.1 mEq/L 3.7  3.7  3.7   Chloride 96 - 112 mEq/L 99  100  101   CO2 19 - 32 mEq/L 32  31  31   Calcium  8.4 - 10.5 mg/dL 9.7  9.3  9.1   Total Protein 6.0 - 8.3 g/dL 7.5   7.3   Total Bilirubin 0.2 - 1.2 mg/dL 0.5   0.3   Alkaline Phos 39 - 117 U/L 53   45   AST 0 - 37 U/L 22   18   ALT 0 - 35 U/L 17   14     Lab Results  Component Value Date   CHOL 130 08/25/2024   HDL 55.70 08/25/2024   LDLCALC 57 08/25/2024   TRIG 88.0 08/25/2024   CHOLHDL 2 08/25/2024   No results for input(s): LIPOA in the last 8760 hours. No components found for: NTPROBNP No results for input(s): PROBNP in the last 8760 hours. Recent Labs    12/24/23 1033  TSH 4.00    Physical Exam:    Today's Vitals   08/26/24 1406  BP: 126/68  Pulse: 80  Weight: 155 lb (70.3 kg)  Height: 5' 1 (1.549 m)   Body mass index is 29.29 kg/m. Wt Readings from Last 3 Encounters:  08/26/24 155 lb (70.3 kg)  08/25/24 155 lb (70.3 kg)  06/24/24 152 lb (68.9 kg)    Physical Exam  Constitutional: No distress.  hemodynamically stable  Neck: No JVD present.  Cardiovascular: Normal rate, regular rhythm, S1 normal and S2 normal. Exam reveals no gallop, no S3 and no S4.  No murmur heard. Pulmonary/Chest: Effort normal and  breath sounds normal. No stridor. She has no wheezes. She has no rales.  Musculoskeletal:        General: No edema.     Cervical back: Neck supple.  Skin: Skin is warm.     Impression & Recommendation(s):  Impression:   ICD-10-CM  1. Agatston coronary artery calcium  score between 100 and 400  R93.1 ECHOCARDIOGRAM COMPLETE    Cardiac Stress Test: Informed Consent Details: Physician/Practitioner Attestation; Transcribe to consent form and obtain patient signature    2. Aortic atherosclerosis  I70.0 EKG 12-Lead    Cardiac Stress Test: Informed Consent Details: Physician/Practitioner Attestation; Transcribe to consent form and obtain patient signature    3. Precordial pain  R07.2 MYOCARDIAL PERFUSION IMAGING    ECHOCARDIOGRAM COMPLETE    4. Essential hypertension  I10 EKG 12-Lead    5. Mixed hyperlipidemia  E78.2        Recommendation(s):  Agatston coronary artery calcium  score between 100 and 400 Aortic atherosclerosis Precordial pain Chest pain is predominantly noncardiac. EKG is nonischemic CAC: 275 all within the LAD distribution placing her at the 92nd percentile for matched cohorts Echo will be ordered to evaluate for structural heart disease and left ventricular systolic function. Exercise nuclear stress test to evaluate for functional capacity and exercise-induced ischemia Continue Lipitor 20 mg p.o. daily. Start 81 mg p.o. daily aspirin given her family history, CAC which places her at the 92nd percentile.  Patient endorses no prior history of bleeding. As long as the echocardiogram and exercise nuclear stress test are favorable no additional cardiovascular testing is warranted.  Patient is educated on importance of improving her modifiable cardiovascular risk factors.  If the diagnostic workup warrants additional testing she will be called in sooner otherwise I will see her back on appearing basis.  Patient agreeable with the plan of care.  Essential hypertension Office  blood pressures are very well-controlled. Continue lisinopril  40 mg p.o. daily. Continue hydrochlorothiazide  25 mg p.o. daily  Mixed hyperlipidemia Continue Lipitor 20 mg p.o. daily. LDL 112 mg/dL in April 2025 LDL 57 mg/dL December 2025 Does not endorse myalgias. Cardiology following peripherally, managed by primary care provider.   Orders Placed:  Orders Placed This Encounter  Procedures   Cardiac Stress Test: Informed Consent Details: Physician/Practitioner Attestation; Transcribe to consent form and obtain patient signature    Physician/Practitioner attestation of informed consent for procedure/surgical case:   I, the physician/practitioner, attest that I have discussed with the patient the benefits, risks, side effects, alternatives, likelihood of achieving goals and potential problems during recovery for the procedure that I have provided informed consent.    Procedure:   Exercise Nuclear Stress Test    Indication/Reason:   Chest Pain   MYOCARDIAL PERFUSION IMAGING    Patient weight in lbs:   155    Where [facility] this be performed?:   [facility]    Type of stress:   Exercise   EKG 12-Lead   ECHOCARDIOGRAM COMPLETE    Standing Status:   Future    Expected Date:   09/26/2024    Expiration Date:   08/26/2025    Where should this test be performed:   [facility]    Does the patient weigh less than or greater than 250 lbs?:   Patient weighs less than 250 lbs    Perflutren DEFINITY (image enhancing agent) should be administered unless hypersensitivity or allergy exist:   Administer Perflutren    Reason for exam-Echo:   Chest Pain  R07.9     Final Medication List:   No orders of the defined types were placed in this encounter.   There are no discontinued medications.  Current Medications[2]  Consent:   Informed Consent   Shared Decision Making/Informed Consent The risks [chest pain, shortness of breath,  cardiac arrhythmias, dizziness, blood pressure  fluctuations, myocardial infarction, stroke/transient ischemic attack, nausea, vomiting, allergic reaction, radiation exposure, metallic taste sensation and life-threatening complications (estimated to be 1 in 10,000)], benefits (risk stratification, diagnosing coronary artery disease, treatment guidance) and alternatives of a nuclear stress test were discussed in detail with Ms. Zuba and she agrees to proceed.     Disposition:   As needed Patient may be asked to follow-up sooner based on the results of the above-mentioned testing.  Medical Decision Making:  I have reviewed external notes provided by primary care provider , dated 08/25/2024.   Discussed management of at least two chronic comorbid conditions.  I have independently interpreted results of the EKG dated 08/26/24, labs dated 08/2024 , and reviewed the results under studies reviewed as part of medical decision making.   Patient education provided at today's visit.   Prescription drug management including but not limited to: addition/titration/discontinuation of medical therapy, medication reconciliation, discussed potential side effects, follow up labs either reviewed or ordered for monitoring safety.   Diagnostic testing ordered - see above.  Patient participated in shared decision making regarding her cardiovascular care and informed consent obtained when required.  I have independently formulated and discussed the assessment and plan with the patient. This note will be shared with the patient's primary care provider to coordinate care.   Patient will continue longitudinal follow-up with primary care provider for management of her comorbid conditions.   Signed, Madonna Michele HAS, [facility]  A Division of  [facility] [address], [place], [place] [address]      [1]  Current Meds  Medication Sig   atorvastatin  (LIPITOR) 20 MG tablet Take 1 tablet (20 mg total) by mouth daily.    Cholecalciferol (VITAMIN D ) 2000 units CAPS Take 1 capsule by mouth daily.   hydrochlorothiazide  (HYDRODIURIL ) 25 MG tablet Take 1 tablet (25 mg total) by mouth daily.   ketoconazole (NIZORAL) 2 % shampoo Apply 1 Application topically 2 (two) times a week.   lisinopril  (ZESTRIL ) 40 MG tablet Take 1 tablet (40 mg total) by mouth daily.   Multiple Vitamin (MULTIVITAMIN ADULT PO) Take by mouth.  [2]  Current Outpatient Medications:    atorvastatin  (LIPITOR) 20 MG tablet, Take 1 tablet (20 mg total) by mouth daily., Disp: 90 tablet, Rfl: 1   Cholecalciferol (VITAMIN D ) 2000 units CAPS, Take 1 capsule by mouth daily., Disp: , Rfl:    hydrochlorothiazide  (HYDRODIURIL ) 25 MG tablet, Take 1 tablet (25 mg total) by mouth daily., Disp: 90 tablet, Rfl: 1   ketoconazole (NIZORAL) 2 % shampoo, Apply 1 Application topically 2 (two) times a week., Disp: , Rfl:    lisinopril  (ZESTRIL ) 40 MG tablet, Take 1 tablet (40 mg total) by mouth daily., Disp: 90 tablet, Rfl: 1   Multiple Vitamin (MULTIVITAMIN ADULT PO), Take by mouth., Disp: , Rfl:

## 2024-08-26 NOTE — Patient Instructions (Signed)
 Medication Instructions:  START Aspirin 81 mg. Take one (1) tablet by mouth once daily.  *If you need a refill on your cardiac medications before your next appointment, please call your pharmacy*  Lab Work: None ordered If you have labs (blood work) drawn today and your tests are completely normal, you will receive your results only by: MyChart Message (if you have MyChart) OR A paper copy in the mail If you have any lab test that is abnormal or we need to change your treatment, we will call you to review the results.  Testing/Procedures: Echocardiogram  Exercise Nuclear Stress Test  Follow-Up: At [facility], you and your health needs are our priority.  As part of our continuing mission to provide you with exceptional heart care, our providers are all part of one team.  This team includes your primary Cardiologist (physician) and Advanced Practice Providers or APPs (Physician Assistants and Nurse Practitioners) who all work together to provide you with the care you need, when you need it.  Your next appointment:   As Needed  Provider:   Madonna Large, DO    We recommend signing up for the patient portal called MyChart.  Sign up information is provided on this After Visit Summary.  MyChart is used to connect with patients for Virtual Visits (Telemedicine).  Patients are able to view lab/test results, encounter notes, upcoming appointments, etc.  Non-urgent messages can be sent to your provider as well.   To learn more about what you can do with MyChart, go to [email or web address].   Other Instructions Your physician has requested that you have an echocardiogram. Echocardiography is a painless test that uses sound waves to create images of your heart. It provides your doctor with information about the size and shape of your heart and how well your hearts chambers and valves are working. This procedure takes approximately one hour. There are no restrictions for this  procedure. Please do NOT wear cologne, perfume, aftershave, or lotions (deodorant is allowed). Please arrive 15 minutes prior to your appointment time.  Please note: We ask at that you not bring children with you during ultrasound (echo/ vascular) testing. Due to room size and safety concerns, children are not allowed in the ultrasound rooms during exams. Our front office staff cannot provide observation of children in our lobby area while testing is being conducted. An adult accompanying a patient to their appointment will only be allowed in the ultrasound room at the discretion of the ultrasound technician under special circumstances. We apologize for any inconvenience.   Exercise/Lexiscan Myoview  (Stress Test) Instructions  Please arrive 15 minutes prior to your appointment time for registration and insurance purposes.   The test will take approximately 3 to 4 hours to complete; you may bring reading material.  If someone comes with you to your appointment, they will need to remain in the main lobby due to limited space in the testing area. **If you are pregnant or breastfeeding, please notify the nuclear lab prior to your appointment**   How to prepare for your Myocardial Perfusion Test: Do not eat or drink 3 hours prior to your test, except you may have water. Do not consume products containing caffeine (regular or decaffeinated) 12 hours prior to your test. (ex: coffee, chocolate, sodas, tea). Do bring a list of your current medications with you.  If not listed below, you may take your medications as normal. Do wear comfortable clothes (no dresses or overalls) and walking shoes, tennis shoes preferred (No  heels or open toe shoes are allowed). Do NOT wear cologne, perfume, aftershave, or lotions (deodorant is allowed). If these instructions are not followed, your test will have to be rescheduled.   Please report to [address] [place], [place] [address] for your test.  If you have questions  or concerns about your appointment, you can call the Nuclear Lab at [phone].   If you cannot keep your appointment, please provide 24 hours notification to the Nuclear Lab, to avoid a possible $50 charge to your account.

## 2024-08-31 LAB — LIPOPROTEIN A (LPA): Lipoprotein (a): <10 nmol/L (ref <75)

## 2024-10-04 ENCOUNTER — Encounter (HOSPITAL_COMMUNITY): Payer: Self-pay

## 2024-10-04 ENCOUNTER — Telehealth (HOSPITAL_COMMUNITY): Payer: Self-pay

## 2024-10-04 NOTE — Telephone Encounter (Signed)
 Attempted to contact the patient. The answering machine wouldn't let me leave a message. S.Audrey Mendez CCT

## 2024-10-07 ENCOUNTER — Other Ambulatory Visit: Payer: Self-pay | Admitting: Cardiology

## 2024-10-07 DIAGNOSIS — R072 Precordial pain: Secondary | ICD-10-CM

## 2024-10-07 DIAGNOSIS — R079 Chest pain, unspecified: Secondary | ICD-10-CM

## 2024-10-11 ENCOUNTER — Ambulatory Visit (HOSPITAL_COMMUNITY): Admission: RE | Admit: 2024-10-11 | Source: Ambulatory Visit | Attending: Cardiology | Admitting: Cardiology

## 2024-10-11 ENCOUNTER — Ambulatory Visit (HOSPITAL_COMMUNITY)

## 2024-10-17 ENCOUNTER — Ambulatory Visit

## 2024-10-26 ENCOUNTER — Ambulatory Visit

## 2024-11-08 ENCOUNTER — Ambulatory Visit (HOSPITAL_COMMUNITY)

## 2024-12-27 ENCOUNTER — Encounter: Admitting: Family Medicine
